# Patient Record
Sex: Male | Born: 1945 | Race: White | Hispanic: No | Marital: Single | Smoking: Former smoker
Health system: Southern US, Community
[De-identification: ages and names within clinical notes are randomized; demographics above are authoritative.]

## PROBLEM LIST (undated history)

## (undated) DIAGNOSIS — J439 Emphysema, unspecified: Secondary | ICD-10-CM

## (undated) DIAGNOSIS — G4730 Sleep apnea, unspecified: Secondary | ICD-10-CM

## (undated) DIAGNOSIS — I2781 Cor pulmonale (chronic): Secondary | ICD-10-CM

## (undated) DIAGNOSIS — E662 Morbid (severe) obesity with alveolar hypoventilation: Secondary | ICD-10-CM

## (undated) DIAGNOSIS — D751 Secondary polycythemia: Secondary | ICD-10-CM

## (undated) DIAGNOSIS — I219 Acute myocardial infarction, unspecified: Secondary | ICD-10-CM

## (undated) DIAGNOSIS — I2510 Atherosclerotic heart disease of native coronary artery without angina pectoris: Secondary | ICD-10-CM

## (undated) DIAGNOSIS — Z9289 Personal history of other medical treatment: Secondary | ICD-10-CM

## (undated) DIAGNOSIS — J9610 Chronic respiratory failure, unspecified whether with hypoxia or hypercapnia: Secondary | ICD-10-CM

## (undated) DIAGNOSIS — I509 Heart failure, unspecified: Secondary | ICD-10-CM

## (undated) HISTORY — DX: Cor pulmonale (chronic): I27.81

## (undated) HISTORY — DX: Emphysema, unspecified: J43.9

## (undated) HISTORY — DX: Morbid (severe) obesity with alveolar hypoventilation: E66.2

## (undated) HISTORY — DX: Personal history of other medical treatment: Z92.89

## (undated) HISTORY — PX: TONSILLECTOMY: SUR1361

## (undated) HISTORY — DX: Secondary polycythemia: D75.1

## (undated) HISTORY — DX: Atherosclerotic heart disease of native coronary artery without angina pectoris: I25.10

## (undated) HISTORY — DX: Chronic respiratory failure, unspecified whether with hypoxia or hypercapnia: J96.10

## (undated) HISTORY — PX: OTHER SURGICAL HISTORY: SHX169

## (undated) HISTORY — PX: VASECTOMY: SHX75

---

## 1996-12-06 ENCOUNTER — Encounter: Payer: Self-pay | Admitting: Pulmonary Disease

## 1997-01-17 ENCOUNTER — Encounter: Payer: Self-pay | Admitting: Pulmonary Disease

## 1998-03-14 ENCOUNTER — Emergency Department (HOSPITAL_COMMUNITY): Admission: EM | Admit: 1998-03-14 | Discharge: 1998-03-14 | Payer: Self-pay | Admitting: Emergency Medicine

## 1998-06-11 ENCOUNTER — Encounter: Payer: Self-pay | Admitting: Cardiovascular Disease

## 1998-06-11 ENCOUNTER — Inpatient Hospital Stay (HOSPITAL_COMMUNITY): Admission: EM | Admit: 1998-06-11 | Discharge: 1998-06-17 | Payer: Self-pay | Admitting: Emergency Medicine

## 1998-06-12 ENCOUNTER — Encounter: Payer: Self-pay | Admitting: Cardiology

## 1998-06-16 ENCOUNTER — Encounter: Payer: Self-pay | Admitting: Pulmonary Disease

## 1998-07-18 ENCOUNTER — Encounter: Payer: Self-pay | Admitting: Pulmonary Disease

## 2003-10-12 ENCOUNTER — Emergency Department (HOSPITAL_COMMUNITY): Admission: AD | Admit: 2003-10-12 | Discharge: 2003-10-13 | Payer: Self-pay | Admitting: Emergency Medicine

## 2004-08-07 ENCOUNTER — Ambulatory Visit: Payer: Self-pay | Admitting: Pulmonary Disease

## 2004-08-25 ENCOUNTER — Ambulatory Visit: Payer: Self-pay | Admitting: Pulmonary Disease

## 2004-09-01 ENCOUNTER — Ambulatory Visit: Payer: Self-pay | Admitting: Pulmonary Disease

## 2004-09-11 ENCOUNTER — Ambulatory Visit: Payer: Self-pay | Admitting: Pulmonary Disease

## 2004-10-13 ENCOUNTER — Ambulatory Visit: Payer: Self-pay | Admitting: Pulmonary Disease

## 2004-11-11 ENCOUNTER — Ambulatory Visit: Payer: Self-pay | Admitting: Pulmonary Disease

## 2005-05-11 ENCOUNTER — Ambulatory Visit: Payer: Self-pay | Admitting: Pulmonary Disease

## 2005-08-11 ENCOUNTER — Ambulatory Visit: Payer: Self-pay | Admitting: Pulmonary Disease

## 2005-10-05 ENCOUNTER — Ambulatory Visit: Payer: Self-pay | Admitting: Pulmonary Disease

## 2005-10-19 ENCOUNTER — Ambulatory Visit: Payer: Self-pay | Admitting: Pulmonary Disease

## 2006-01-19 ENCOUNTER — Ambulatory Visit: Payer: Self-pay | Admitting: Pulmonary Disease

## 2006-05-03 ENCOUNTER — Ambulatory Visit: Payer: Self-pay | Admitting: Pulmonary Disease

## 2006-07-06 ENCOUNTER — Ambulatory Visit: Payer: Self-pay | Admitting: Pulmonary Disease

## 2006-09-01 ENCOUNTER — Ambulatory Visit: Payer: Self-pay | Admitting: Pulmonary Disease

## 2006-10-14 ENCOUNTER — Ambulatory Visit: Payer: Self-pay | Admitting: Pulmonary Disease

## 2006-10-14 LAB — CONVERTED CEMR LAB
ALT: 35 units/L (ref 0–40)
Alkaline Phosphatase: 72 units/L (ref 39–117)
BUN: 11 mg/dL (ref 6–23)
Calcium: 9.3 mg/dL (ref 8.4–10.5)
Chloride: 100 meq/L (ref 96–112)
Cholesterol: 99 mg/dL (ref 0–200)
GFR calc non Af Amer: 104 mL/min
HDL: 34.2 mg/dL — ABNORMAL LOW (ref >39.0)
Hgb A1c MFr Bld: 5.7 % (ref 4.6–6.0)
Potassium: 4.3 meq/L (ref 3.5–5.1)
Total CHOL/HDL Ratio: 2.9
Total Protein: 6.5 g/dL (ref 6.0–8.3)

## 2006-11-07 ENCOUNTER — Ambulatory Visit: Payer: Self-pay | Admitting: Pulmonary Disease

## 2007-04-12 ENCOUNTER — Ambulatory Visit: Payer: Self-pay | Admitting: Pulmonary Disease

## 2007-06-26 ENCOUNTER — Ambulatory Visit: Payer: Self-pay | Admitting: Pulmonary Disease

## 2007-07-01 DIAGNOSIS — J439 Emphysema, unspecified: Secondary | ICD-10-CM

## 2007-07-01 DIAGNOSIS — J9610 Chronic respiratory failure, unspecified whether with hypoxia or hypercapnia: Secondary | ICD-10-CM

## 2007-07-01 DIAGNOSIS — I279 Pulmonary heart disease, unspecified: Secondary | ICD-10-CM | POA: Insufficient documentation

## 2007-07-01 DIAGNOSIS — E662 Morbid (severe) obesity with alveolar hypoventilation: Secondary | ICD-10-CM | POA: Insufficient documentation

## 2007-07-01 DIAGNOSIS — D751 Secondary polycythemia: Secondary | ICD-10-CM

## 2007-11-20 ENCOUNTER — Telehealth (INDEPENDENT_AMBULATORY_CARE_PROVIDER_SITE_OTHER): Payer: Self-pay | Admitting: *Deleted

## 2007-12-04 ENCOUNTER — Telehealth: Payer: Self-pay | Admitting: Pulmonary Disease

## 2007-12-14 ENCOUNTER — Ambulatory Visit: Payer: Self-pay | Admitting: Pulmonary Disease

## 2007-12-19 ENCOUNTER — Encounter: Payer: Self-pay | Admitting: Pulmonary Disease

## 2007-12-21 ENCOUNTER — Telehealth (INDEPENDENT_AMBULATORY_CARE_PROVIDER_SITE_OTHER): Payer: Self-pay | Admitting: *Deleted

## 2007-12-27 ENCOUNTER — Telehealth (INDEPENDENT_AMBULATORY_CARE_PROVIDER_SITE_OTHER): Payer: Self-pay | Admitting: *Deleted

## 2008-03-14 ENCOUNTER — Encounter: Payer: Self-pay | Admitting: Pulmonary Disease

## 2008-03-21 ENCOUNTER — Telehealth: Payer: Self-pay | Admitting: Pulmonary Disease

## 2008-05-01 ENCOUNTER — Ambulatory Visit: Payer: Self-pay | Admitting: Pulmonary Disease

## 2008-05-03 ENCOUNTER — Encounter: Payer: Self-pay | Admitting: Pulmonary Disease

## 2008-05-03 LAB — CONVERTED CEMR LAB
Alkaline Phosphatase: 74 units/L (ref 39–117)
Bilirubin, Direct: 0.2 mg/dL (ref 0.0–0.3)
CO2: 37 meq/L — ABNORMAL HIGH (ref 19–32)
Chloride: 101 meq/L (ref 96–112)
GFR calc Af Amer: 97 mL/min
Glucose, Bld: 87 mg/dL (ref 70–99)
Lymphocytes Relative: 17.8 % (ref 12.0–46.0)
Monocytes Absolute: 0.4 10*3/uL (ref 0.1–1.0)
Monocytes Relative: 4.9 % (ref 3.0–12.0)
Neutrophils Relative %: 71.8 % (ref 43.0–77.0)
Platelets: 130 10*3/uL — ABNORMAL LOW (ref 150–400)
Potassium: 4.1 meq/L (ref 3.5–5.1)
RDW: 13.7 % (ref 11.5–14.6)
Sodium: 142 meq/L (ref 135–145)
Total Protein: 7.2 g/dL (ref 6.0–8.3)

## 2008-05-07 ENCOUNTER — Ambulatory Visit: Payer: Self-pay | Admitting: Pulmonary Disease

## 2008-05-07 ENCOUNTER — Ambulatory Visit: Payer: Self-pay | Admitting: Cardiology

## 2008-05-07 DIAGNOSIS — R0602 Shortness of breath: Secondary | ICD-10-CM | POA: Insufficient documentation

## 2008-05-08 ENCOUNTER — Telehealth (INDEPENDENT_AMBULATORY_CARE_PROVIDER_SITE_OTHER): Payer: Self-pay | Admitting: *Deleted

## 2008-05-08 ENCOUNTER — Encounter: Payer: Self-pay | Admitting: Pulmonary Disease

## 2008-05-09 LAB — CONVERTED CEMR LAB
BUN: 19 mg/dL (ref 6–23)
CO2: 36 meq/L — ABNORMAL HIGH (ref 19–32)
Calcium: 9.7 mg/dL (ref 8.4–10.5)
Chloride: 102 meq/L (ref 96–112)
Creatinine, Ser: 1 mg/dL (ref 0.4–1.5)
GFR calc Af Amer: 97 mL/min
GFR calc non Af Amer: 80 mL/min
Glucose, Bld: 75 mg/dL (ref 70–99)
Potassium: 4.9 meq/L (ref 3.5–5.1)
Sodium: 143 meq/L (ref 135–145)

## 2008-05-13 ENCOUNTER — Encounter: Payer: Self-pay | Admitting: Pulmonary Disease

## 2008-05-14 ENCOUNTER — Encounter: Payer: Self-pay | Admitting: Pulmonary Disease

## 2008-05-20 ENCOUNTER — Encounter: Payer: Self-pay | Admitting: Pulmonary Disease

## 2008-06-05 ENCOUNTER — Ambulatory Visit: Payer: Self-pay | Admitting: Pulmonary Disease

## 2008-06-19 ENCOUNTER — Telehealth: Payer: Self-pay | Admitting: Pulmonary Disease

## 2008-07-02 ENCOUNTER — Encounter: Payer: Self-pay | Admitting: Pulmonary Disease

## 2008-09-06 ENCOUNTER — Encounter: Payer: Self-pay | Admitting: Pulmonary Disease

## 2008-10-15 ENCOUNTER — Telehealth (INDEPENDENT_AMBULATORY_CARE_PROVIDER_SITE_OTHER): Payer: Self-pay | Admitting: *Deleted

## 2008-10-21 ENCOUNTER — Encounter: Payer: Self-pay | Admitting: Pulmonary Disease

## 2008-11-07 ENCOUNTER — Ambulatory Visit: Payer: Self-pay | Admitting: Pulmonary Disease

## 2008-12-12 ENCOUNTER — Encounter: Payer: Self-pay | Admitting: Pulmonary Disease

## 2008-12-21 ENCOUNTER — Encounter: Payer: Self-pay | Admitting: Pulmonary Disease

## 2009-01-15 ENCOUNTER — Encounter: Payer: Self-pay | Admitting: Pulmonary Disease

## 2009-01-23 ENCOUNTER — Encounter: Payer: Self-pay | Admitting: Pulmonary Disease

## 2009-03-28 ENCOUNTER — Ambulatory Visit: Payer: Self-pay | Admitting: Pulmonary Disease

## 2009-06-30 IMAGING — CT CT ANGIO CHEST
2 of 4 series · 19 of 46 positions shown · IV contrast (Omnipaque 300)
Comparison: 05/01/2008 chest x-ray

CLINICAL DATA: Increased short of breath and chest pain; previous
smoker.  History COPD/emphysema.  Sleep apnea.  Coronary artery
stents.

CT ANGIOGRAPHY CHEST
TECHNIQUE: Multidetector CT imaging of the chest using the
standard protocol during bolus administration of intravenous
contrast. Multiplanar reconstructed images obtained and reviewed to
evaluate the vascular anatomy.
Contrast: 80 ml Omnipaque 350 IV

[Series 5: pe_xxl 1.0 b20f st · axial · 0.94mm/px · z∈[-329,-35]mm · 16 of 324 slices shown]
[im 15/324  lung]
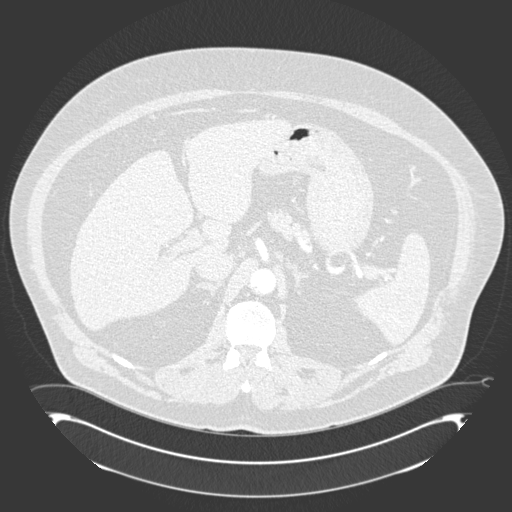
[im 43/324  soft-tissue]
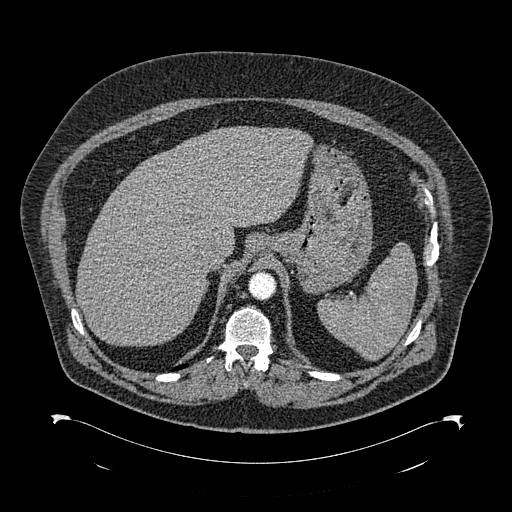
[im 57/324  lung]
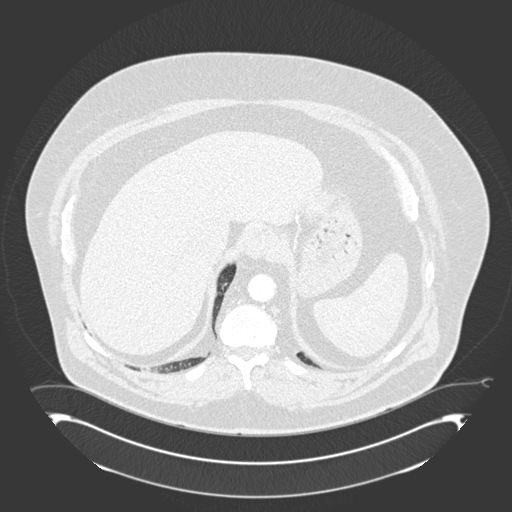
[im 71/324  soft-tissue]
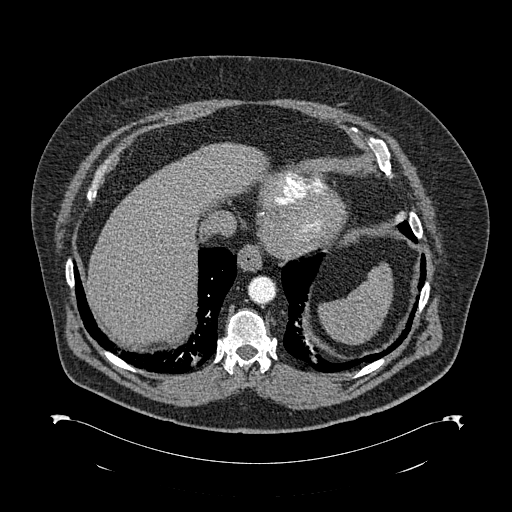
[im 99/324  lung]
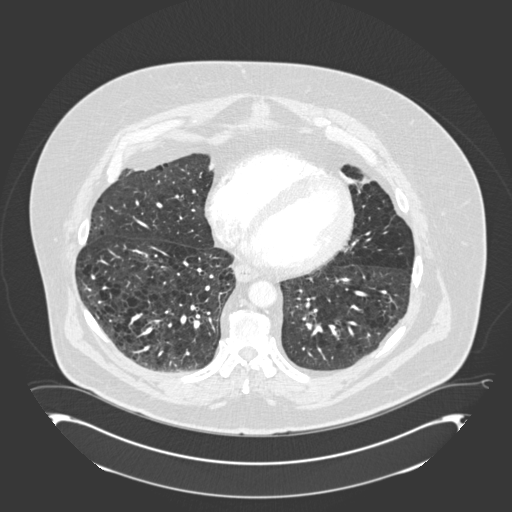
[im 113/324  soft-tissue]
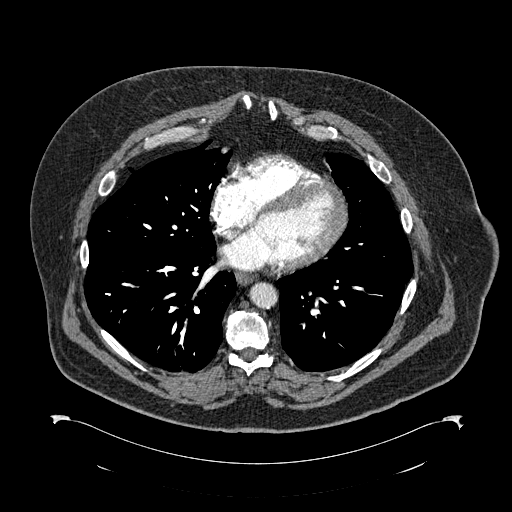
[im 127/324  lung]
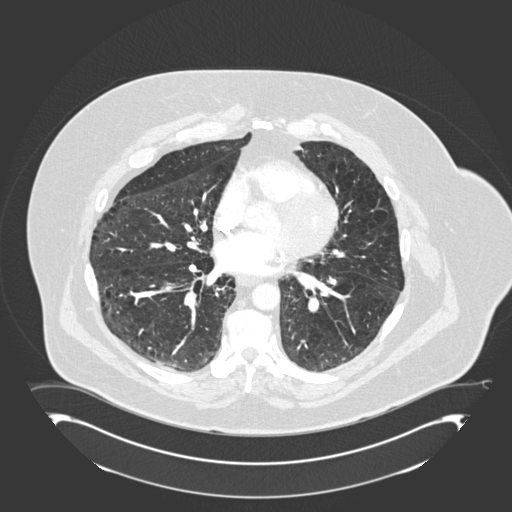
[im 155/324  soft-tissue]
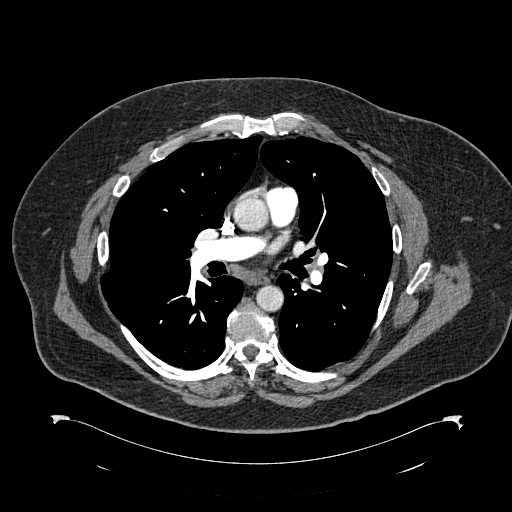
[im 169/324  lung]
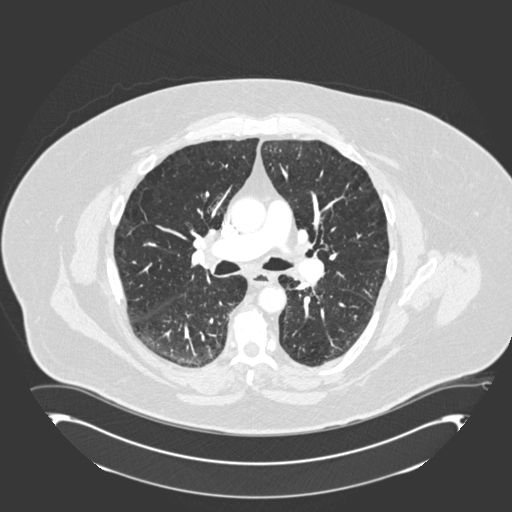
[im 197/324  soft-tissue]
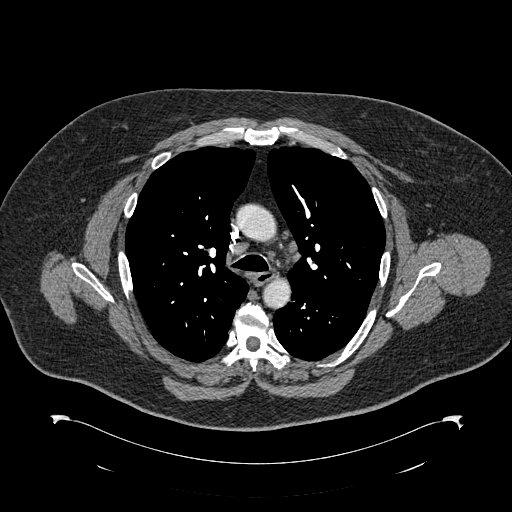
[im 211/324  lung]
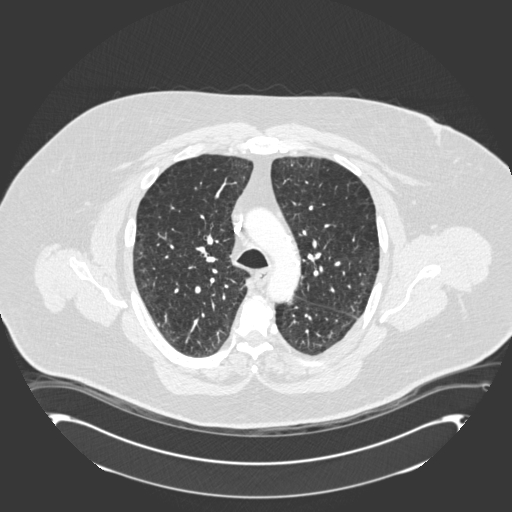
[im 225/324  soft-tissue]
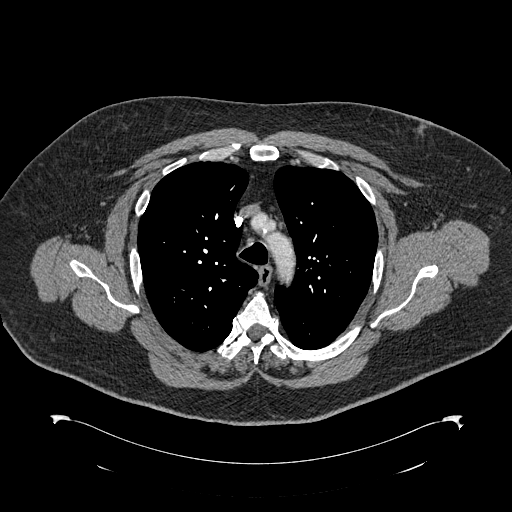
[im 253/324  lung]
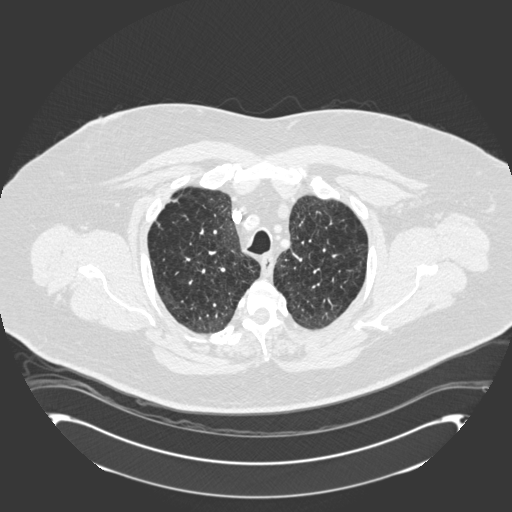
[im 267/324  soft-tissue]
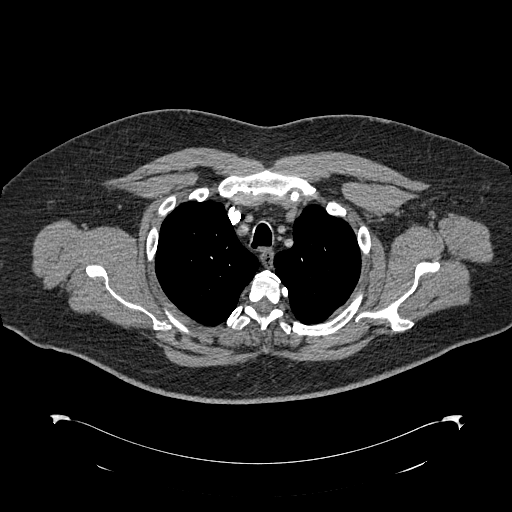
[im 281/324  lung]
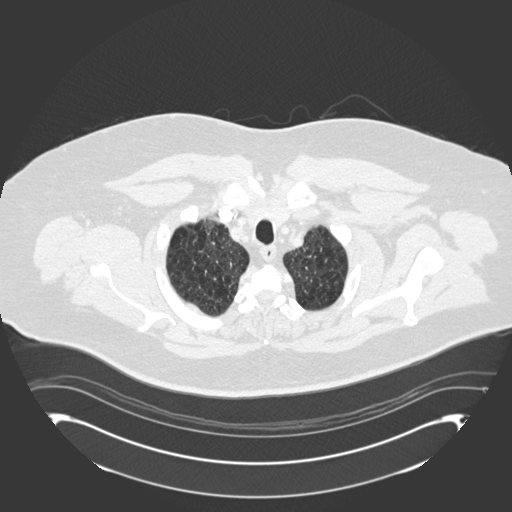
[im 309/324  soft-tissue]
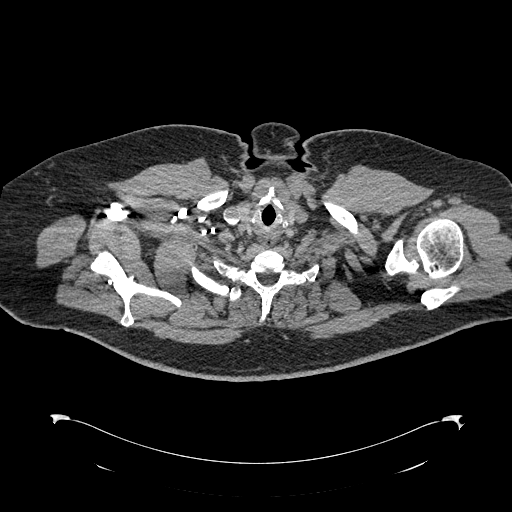

[Series 602: <mpr thick range> · coronal · 0.94mm/px · 3 of 142 slices shown]
[im 48/142  soft-tissue]
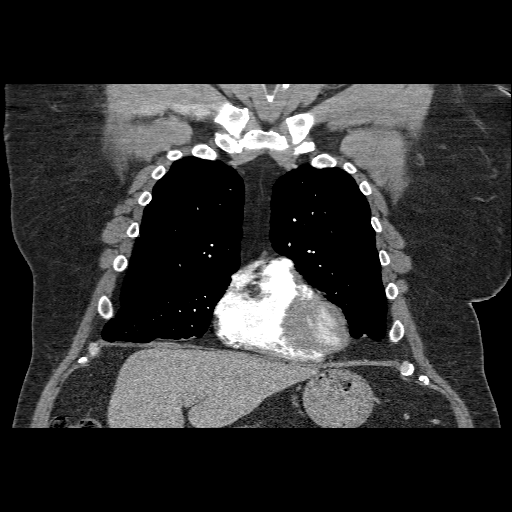
[im 63/142  soft-tissue]
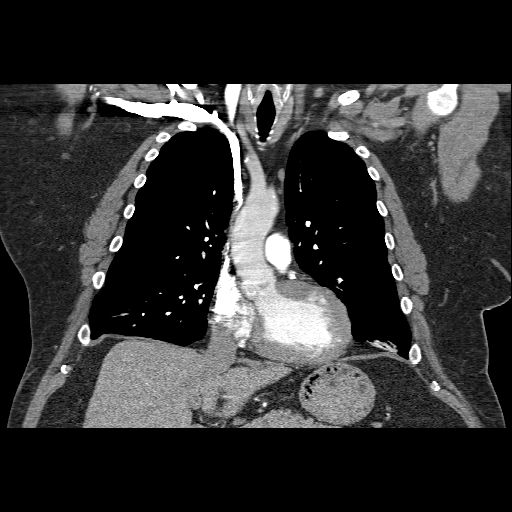
[im 79/142  soft-tissue]
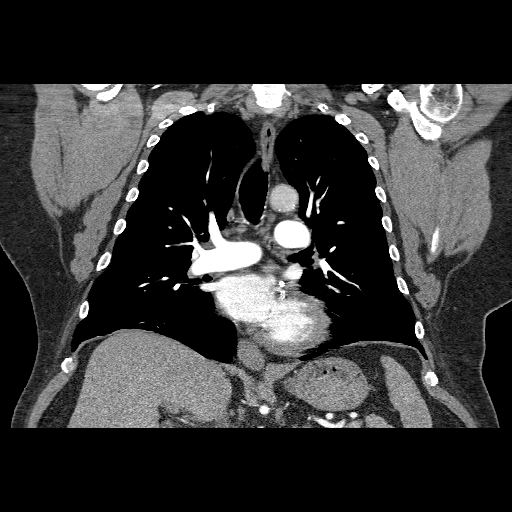

[19 of 46 positions shown; findings below may reference images not displayed]

FINDINGS: Excellent degree of contrast opacification of the pulmonary
arteries.  No findings to suggest pulmonary emboli.  No acute
pulmonary process.  Centrilobular emphysema.  Peripheral subpleural
blebs mainly on the right (paraseptal emphysema).  Bihilar
adenopathy.  Subcarinal adenopathy.  1.1 cm subcarinal node (image
55). Main pulmonary trunk diameter is 2.9 cm.  No pericardial or
pleural effusions.  Linear scarring in the lingular segment.
Esophageal  wall thickening, mainly distally.  This may be due to
esophagitis, possibly on the basis of reflux..
IMPRESSION: Negative for PE.  COPD/emphysema.  Mild mediastinal and bihilar
adenopathy.  Esophageal wall thickening.

## 2009-07-14 ENCOUNTER — Ambulatory Visit: Payer: Self-pay | Admitting: Pulmonary Disease

## 2009-07-17 ENCOUNTER — Encounter: Payer: Self-pay | Admitting: Pulmonary Disease

## 2009-07-29 ENCOUNTER — Telehealth (INDEPENDENT_AMBULATORY_CARE_PROVIDER_SITE_OTHER): Payer: Self-pay | Admitting: *Deleted

## 2009-09-20 ENCOUNTER — Encounter: Payer: Self-pay | Admitting: Pulmonary Disease

## 2009-11-12 ENCOUNTER — Ambulatory Visit: Payer: Self-pay | Admitting: Pulmonary Disease

## 2010-01-31 ENCOUNTER — Encounter: Payer: Self-pay | Admitting: Pulmonary Disease

## 2010-02-02 ENCOUNTER — Inpatient Hospital Stay (HOSPITAL_COMMUNITY): Admission: AD | Admit: 2010-02-02 | Discharge: 2010-02-05 | Payer: Self-pay | Admitting: Pulmonary Disease

## 2010-02-02 ENCOUNTER — Ambulatory Visit: Payer: Self-pay | Admitting: Pulmonary Disease

## 2010-02-02 DIAGNOSIS — J9600 Acute respiratory failure, unspecified whether with hypoxia or hypercapnia: Secondary | ICD-10-CM | POA: Insufficient documentation

## 2010-02-03 ENCOUNTER — Encounter: Payer: Self-pay | Admitting: Pulmonary Disease

## 2010-02-06 ENCOUNTER — Telehealth: Payer: Self-pay | Admitting: Pulmonary Disease

## 2010-02-13 ENCOUNTER — Ambulatory Visit: Payer: Self-pay | Admitting: Pulmonary Disease

## 2010-02-13 DIAGNOSIS — IMO0001 Reserved for inherently not codable concepts without codable children: Secondary | ICD-10-CM | POA: Insufficient documentation

## 2010-02-13 LAB — CONVERTED CEMR LAB
Basophils Relative: 0 % (ref 0.0–3.0)
Eosinophils Relative: 0.5 % (ref 0.0–5.0)
HCT: 50.1 % (ref 39.0–52.0)
Lymphs Abs: 1 10*3/uL (ref 0.7–4.0)
MCV: 90 fL (ref 78.0–100.0)
Monocytes Absolute: 0.3 10*3/uL (ref 0.1–1.0)
Neutro Abs: 11.2 10*3/uL — ABNORMAL HIGH (ref 1.4–7.7)
Platelets: 78 10*3/uL — ABNORMAL LOW (ref 150.0–400.0)
RBC: 5.56 M/uL (ref 4.22–5.81)
WBC: 12.6 10*3/uL — ABNORMAL HIGH (ref 4.5–10.5)

## 2010-02-16 ENCOUNTER — Telehealth (INDEPENDENT_AMBULATORY_CARE_PROVIDER_SITE_OTHER): Payer: Self-pay | Admitting: *Deleted

## 2010-02-17 ENCOUNTER — Telehealth (INDEPENDENT_AMBULATORY_CARE_PROVIDER_SITE_OTHER): Payer: Self-pay | Admitting: *Deleted

## 2010-02-18 ENCOUNTER — Encounter: Payer: Self-pay | Admitting: Pulmonary Disease

## 2010-02-26 ENCOUNTER — Encounter: Payer: Self-pay | Admitting: Pulmonary Disease

## 2010-02-26 ENCOUNTER — Telehealth: Payer: Self-pay | Admitting: Pulmonary Disease

## 2010-03-06 ENCOUNTER — Encounter: Payer: Self-pay | Admitting: Pulmonary Disease

## 2010-03-10 ENCOUNTER — Encounter: Payer: Self-pay | Admitting: Pulmonary Disease

## 2010-03-10 DIAGNOSIS — Z9289 Personal history of other medical treatment: Secondary | ICD-10-CM

## 2010-03-10 HISTORY — DX: Personal history of other medical treatment: Z92.89

## 2010-03-11 ENCOUNTER — Encounter: Payer: Self-pay | Admitting: Pulmonary Disease

## 2010-03-11 ENCOUNTER — Ambulatory Visit: Payer: Self-pay | Admitting: Oncology

## 2010-03-12 LAB — CONVERTED CEMR LAB
Basophils Absolute: 0 10*3/uL (ref 0.0–0.1)
Eosinophils Absolute: 0.3 10*3/uL (ref 0.0–0.7)
Eosinophils Relative: 6 % — ABNORMAL HIGH (ref 0–5)
HCT: 51.4 % (ref 39.0–52.0)
MCHC: 33.3 g/dL (ref 30.0–36.0)
MCV: 92.6 fL (ref 78.0–100.0)
Platelets: 91 10*3/uL — ABNORMAL LOW (ref 150–400)
RDW: 14 % (ref 11.5–15.5)

## 2010-03-25 ENCOUNTER — Encounter: Payer: Self-pay | Admitting: Pulmonary Disease

## 2010-03-25 LAB — CBC & DIFF AND RETIC
Basophils Absolute: 0 10*3/uL (ref 0.0–0.1)
EOS%: 4.2 % (ref 0.0–7.0)
HGB: 16.9 g/dL (ref 13.0–17.1)
LYMPH%: 21.4 % (ref 14.0–49.0)
MCH: 30.7 pg (ref 27.2–33.4)
MCV: 90.4 fL (ref 79.3–98.0)
MONO%: 5.7 % (ref 0.0–14.0)
Platelets: 92 10*3/uL — ABNORMAL LOW (ref 140–400)
RBC: 5.5 10*6/uL (ref 4.20–5.82)
RDW: 13.6 % (ref 11.0–14.6)
Retic %: 0.96 % (ref 0.50–1.60)

## 2010-03-25 LAB — MORPHOLOGY
PLT EST: DECREASED
RBC Comments: NORMAL

## 2010-03-26 LAB — ANTI-NUCLEAR AB-TITER (ANA TITER): ANA Titer 1: NEGATIVE

## 2010-03-27 ENCOUNTER — Encounter: Payer: Self-pay | Admitting: Pulmonary Disease

## 2010-04-16 ENCOUNTER — Ambulatory Visit: Payer: Self-pay | Admitting: Pulmonary Disease

## 2010-05-21 ENCOUNTER — Encounter: Payer: Self-pay | Admitting: Pulmonary Disease

## 2010-07-14 ENCOUNTER — Encounter: Payer: Self-pay | Admitting: Pulmonary Disease

## 2010-07-14 DIAGNOSIS — E785 Hyperlipidemia, unspecified: Secondary | ICD-10-CM

## 2010-07-15 ENCOUNTER — Ambulatory Visit: Payer: Self-pay | Admitting: Pulmonary Disease

## 2010-07-16 LAB — CONVERTED CEMR LAB
Direct LDL: 194.3 mg/dL
Total CHOL/HDL Ratio: 6

## 2010-09-07 ENCOUNTER — Telehealth: Payer: Self-pay | Admitting: Pulmonary Disease

## 2010-09-10 ENCOUNTER — Encounter: Payer: Self-pay | Admitting: Pulmonary Disease

## 2010-09-10 ENCOUNTER — Ambulatory Visit
Admission: RE | Admit: 2010-09-10 | Discharge: 2010-09-10 | Payer: Self-pay | Source: Home / Self Care | Attending: Pulmonary Disease | Admitting: Pulmonary Disease

## 2010-09-10 ENCOUNTER — Telehealth: Payer: Self-pay | Admitting: Pulmonary Disease

## 2010-09-11 ENCOUNTER — Telehealth: Payer: Self-pay | Admitting: Pulmonary Disease

## 2010-09-29 NOTE — Letter (Signed)
[facility]   Imported By: Sherian Rein 04/22/2010 10:09:37  _____________________________________________________________________  External Attachment:    Type:   Image     Comment:   External Document

## 2010-09-29 NOTE — Miscellaneous (Signed)
Summary: flu vax on 05/21/10 by [facility]  Clinical Lists Changes  Observations: Added new observation of FLU VAX: given by [facility] (05/21/2010 16:48)      Immunization History:  Influenza Immunization History:    Influenza:  given by [facility] (05/21/2010)

## 2010-09-29 NOTE — Assessment & Plan Note (Signed)
Summary: post hospital f/u for myositis and copd exacerbation.   Copy to:  Alanda Amass Primary Provider/Referring Provider:  Shelle Iron  CC:  Pt is here for a post hosp f/u appt.  Pt was at [facility] from June 6 to June 9 and 2011.  Pt c/o coughing up white sputum.  Pt states breathing is back to normal baseline.  Pt has finished abx and pred taper. .  History of Present Illness: The pt comes in today for f/u after his recent hospitalization.  He had acute myositis that I believe was secondary to recently starting on zocor, had thrombocytopenia that I suspect was drug induced, and a mild copd exacerbation.  His cpk and platelet count improved with d/c zocor, and his ESR was normal.  He was treated with abx and a course of prednisone, and has seen a return of his breathing back to baseline.  Current Medications (verified): 1)  Gnp Aspirin Low Dose 81 Mg Tbec (Aspirin) .... Take 1 Tablet By Mouth Once A Day 2)  Atenolol 25 Mg  Tabs (Atenolol) .... Take One Tab By Mouth Once Daily 3)  Furosemide 40 Mg  Tabs (Furosemide) .... Take 1 Tablet By Mouth Once A Day 4)  Multivitamins   Tabs (Multiple Vitamin) .... Take One Tab By Mouth Once Daily 5)  Pulmicort 0.5 Mg/10ml  Susp (Budesonide (Inhalation)) .... Use As Directed Two Times A Day 6)  Albuterol Sulfate (2.5 Mg/33ml) 0.083%  Nebu (Albuterol Sulfate) .... Use 1 Vial in [employer] Four Times A Day 7)  Ipratropium Bromide 0.02 % Soln (Ipratropium Bromide) .Marland Kitchen.. 1 Vial Via [employer] Four Times A Day 8)  Bipap .... At Bedtime  Allergies (verified): 1)  ! * Simvastatin  Review of Systems       The patient complains of shortness of breath with activity and productive cough.  The patient denies shortness of breath at rest, non-productive cough, coughing up blood, chest pain, irregular heartbeats, acid heartburn, indigestion, loss of appetite, weight change, abdominal pain, difficulty swallowing, sore throat, tooth/dental problems, headaches, nasal congestion/difficulty breathing  through nose, sneezing, itching, ear ache, anxiety, depression, hand/feet swelling, joint stiffness or pain, rash, change in color of mucus, and fever.    Vital Signs:  Patient profile:   65 year old male Height:      65 inches Weight:      237 pounds BMI:     39.58 O2 Sat:      90 % on 4 LPM pulsed Temp:     98.0 degrees F oral Pulse rate:   83 / minute BP sitting:   116 / 70  (left arm) Cuff size:   regular  Vitals Entered By: Arman Filter LPN (February 13, 2010 8:56 AM)  O2 Flow:  4 LPM pulsed CC: Pt is here for a post hosp f/u appt.  Pt was at [facility] from June 6 to February 05, 2010.  Pt c/o coughing up white sputum.  Pt states breathing is back to normal baseline.  Pt has finished abx and pred taper.  Comments Medications reviewed with patient Arman Filter LPN  February 13, 2010 9:04 AM    Physical Exam  General:  obese male in nad Lungs:  decreased bs throughout, no wheezing or rhonchi Heart:  rrr, no mrg Extremities:  no edema noted, no cyanosis Neurologic:  alert and oriented, moves all 4.   Impression & Recommendations:  Problem # 1:  MYOSITIS (ICD-729.1) the pt was recently in the hospital with acute myositis that  I think was due to simvastatin.  His medication was recently changed from lipitor to simvastatin, and his symptoms began shortly thereafter.  By the end of his hospitalization, his cpk's were returning to normal.  Will recheck his levels today, and restart lipitor once they are normalized.  Of note, his ESR in hospital was normal.  He also had thrombocytopenia in the hospital, and this normalized as well by day of discharge.  Problem # 2:  EMPHYSEMA (ICD-492.8) the pt was having increased congestion in the hospital, and therefore treated for mild copd exacerbation.  He had thick tenacious secretions upon discharge, but improved with mucinex and flutter valve.  He feels that he is now back to baseline after finishing abx and prednisone.  Other Orders: Est. Patient Level  III (78295) TLB-CBC Platelet - w/Differential (85025-CBCD) TLB-CK Total Only(Creatine Kinase/CPK) (82550-CK)  Patient Instructions: 1)  stay off simvastatin for now, but will recheck your bloodwork and decide about restarting lipitor 2)  no change in current meds. 3)  followup with me in 2mos or sooner if problems.

## 2010-09-29 NOTE — Assessment & Plan Note (Signed)
Summary: rov for ohs, copd   Visit Type:  Follow-up Copy to:  Alanda Amass Primary Taurean Ju/Referring Glendia Olshefski:  Marcelyn Bruins   CC:  3 month follow up. pt states he has "good and "bad" days. pt states he has no new concerns. pt denies any cough. Pt quit smoking 2002. Pt states he still uses bipap at night. pt states he needs a new mask. Marland Kitchen  History of Present Illness: the pt comes in today for f/u of his known emphysema, OHS/OSA.  He is wearing his bipap and oxygen compliantly, but is due for a new mask.  He denies any worsening of his breathing, and feels he is near his usual baseline.  He denies any cough or congestion.  He also had his fasting lipid profile today to recheck his numbers off zocor.  Recall, he had acute myositis related to zocor that required hospitalization.  He has been on lipitor in the past which he tolerated well.  Current Medications (verified): 1)  Gnp Aspirin Low Dose 81 Mg Tbec (Aspirin) .... Take 1 Tablet By Mouth Once A Day 2)  Atenolol 25 Mg  Tabs (Atenolol) .... Take One Tab By Mouth Once Daily 3)  Furosemide 40 Mg  Tabs (Furosemide) .... Take 1 Tablet By Mouth Once A Day 4)  Multivitamins   Tabs (Multiple Vitamin) .... Take One Tab By Mouth Once Daily 5)  Pulmicort 0.5 Mg/44ml  Susp (Budesonide (Inhalation)) .... Use As Directed Two Times A Day 6)  Albuterol Sulfate (2.5 Mg/22ml) 0.083%  Nebu (Albuterol Sulfate) .... Use 1 Vial in [employer] Four Times A Day 7)  Ipratropium Bromide 0.02 % Soln (Ipratropium Bromide) .Marland Kitchen.. 1 Vial Via [employer]hn Four Times A Day 8)  Bipap .... At Bedtime  Allergies (verified): 1)  ! * Simvastatin  Review of Systems       The patient complains of shortness of breath with activity and shortness of breath at rest.  The patient denies productive cough, non-productive cough, coughing up blood, chest pain, irregular heartbeats, acid heartburn, indigestion, loss of appetite, weight change, abdominal pain, difficulty swallowing, sore throat, tooth/dental  problems, headaches, nasal congestion/difficulty breathing through nose, sneezing, itching, ear ache, anxiety, depression, hand/feet swelling, joint stiffness or pain, rash, change in color of mucus, and fever.    Vital Signs:  Patient profile:   65 year old male Height:      65 inches Weight:      235.50 pounds BMI:     39.33 O2 Sat:      91 % on 3 L/min pulsed Temp:     98 degrees F oral Pulse rate:   87 / minute BP sitting:   118 / 80  (left arm) Cuff size:   large  Vitals Entered By: Carver Fila (July 15, 2010 8:50 AM)  O2 Flow:  3 L/min pulsed CC: 3 month follow up. pt states he has "good and "bad" days. pt states he has no new concerns. pt denies any cough. Pt quit smoking 2002. Pt states he still uses bipap at night. pt states he needs a new mask.  Comments meds and allergies updated Phone number updated Carver Fila  July 15, 2010 8:50 AM    Physical Exam  General:  obese male in nad Nose:  no skin breakdown or pressure necrosis from cpap mask Lungs:  decreased bs, no crackles or rhonchi Heart:  rrr, no mrg Extremities:  no edema or cyanosis  Neurologic:  alert and oriented, moves all 4.  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  Will arrange for a new mask, and he is to stay on bipap.  Problem # 2:  EMPHYSEMA (ICD-492.8) He is maintaining on his bronchodilator regimen, and has not had any recent acute exacerbation.  Other Orders: Est. Patient Level III (70623) DME Referral (DME)  Patient Instructions: 1)  will get neb supplies and mask for you from [facility] 2)  continue to work on weight loss. 3)  will call you about your lipid profile. 4)  followup with me in 4mos.

## 2010-09-29 NOTE — Assessment & Plan Note (Signed)
Summary: rov for OHS, emphysema   Copy to:  Alanda Amass Primary Provider/Referring Provider:  Shelle Iron  CC:  Pt is here for a 4 month f/u appt. Pt states breathing unchanged from last visit.  Pt denied any new complaints.  .  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  Overall, he feels he is at a stable baseline, and has been compliant with his oxygen and bipap.  His weight is up 5 pounds since his last visit though.  He denies any congestion, cough, or worsening sob.  He has not had LE edema.  Medications Prior to Update: 1)  Gnp Aspirin Low Dose 81 Mg Tbec (Aspirin) .... Take 1 Tablet By Mouth Once A Day 2)  Atenolol 25 Mg  Tabs (Atenolol) .... Take One Tab By Mouth Once Daily 3)  Furosemide 40 Mg  Tabs (Furosemide) .... Take 1 Tablet By Mouth Once A Day 4)  Simvastatin 40 Mg Tabs (Simvastatin) .... Take 1 Tablet By Mouth Once A Day 5)  Multivitamins   Tabs (Multiple Vitamin) .... Take One Tab By Mouth Once Daily 6)  Pulmicort 0.5 Mg/47ml  Susp (Budesonide (Inhalation)) .... Use As Directed Two Times A Day 7)  Albuterol Sulfate (2.5 Mg/76ml) 0.083%  Nebu (Albuterol Sulfate) .... Use 1 Vial in [employer] Four Times A Day 8)  Ipratropium Bromide 0.02 % Soln (Ipratropium Bromide) .Marland Kitchen.. 1 Vial Via [facility]hn Four Times A Day 9)  Bipap .... At Bedtime 10)  Acyclovir 200 Mg Caps (Acyclovir) .... Take 2 Tabs By Mouth Two Times A Day  Allergies (verified): No Known Drug Allergies  Review of Systems      See HPI  Vital Signs:  Patient profile:   65 year old male Height:      65 inches Weight:      249.38 pounds O2 Sat:      84 % on 3 LPM pulsed  Temp:     98.1 degrees F oral Pulse rate:   82 / minute BP sitting:   126 / 72  (left arm) Cuff size:   regular  Vitals Entered By: Arman Filter LPN (November 12, 2009 1:31 PM)  O2 Flow:  3 LPM pulsed  CC: Pt is here for a 4 month f/u appt. Pt states breathing unchanged from last  visit.  Pt denied any new complaints.   Comments Medications reviewed with patient  Arman Filter LPN  November 12, 2009 1:38 PM    Physical Exam  General:  obese male in nad Nose:  no skin breakdown or pressure necrosis from cpap mask Lungs:  decreased bs, but totally clear to auscultation Heart:  rrr, no mrg Extremities:  no edema or cyanosis Neurologic:  alert,not sleepy, moves all 4.   Impression & Recommendations:  Problem # 1:  OBESITY HYPOVENTILATION SYNDROME (ICD-278.8) the pt is doing well with his bipap, and feels that he is resting well.  He denies any significant EDS.  I have reminded him the key is further weight loss.  Problem # 2:  EMPHYSEMA (ICD-492.8) he has no bronchospasm on exam today, and has not had any recent acute exacerbation.  He is to continue on current meds.  Other Orders: Est. Patient Level II (09811)  Patient Instructions: 1)  no change in meds 2)  work on weight loss 3)  will track down lab results and let you know results. 4)  followup with me in 4mos.

## 2010-09-29 NOTE — Progress Notes (Signed)
Summary: LAB QUESTION  Phone Note Call from Patient Call back at Home Phone [phone]   Caller: Patient Call For: [facility] Summary of Call: WANT TO SPEAK TO MEGAN ABOUT SOME LAB WORK Initial call taken by: Lacinda Axon,  February 16, 2010 12:31 PM  Follow-up for Phone Call        Spoke with pt.  He states that he got a letter from [facility] stating that ins would not cover his PSA test because he had more than one on a year.  He wants to know if [facility] ordered another PSA besides the one he had done on 01/31/10.  I advised that looking back through the chart I only see the one done this month.  Pt verbalized understanding and states that he will call [facility] to discuss his bill further. Follow-up by: Vernie Murders,  February 16, 2010 2:54 PM

## 2010-09-29 NOTE — Progress Notes (Signed)
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 Follow-up by: Barbaraann Share MD,  February 26, 2010 3:52 PM     Appended Document: seat belt [facility] did a letter and it is at the front desk for pt to pick up.  [id number]

## 2010-09-29 NOTE — Progress Notes (Signed)
Summary: fax  Phone Note Call from Patient Call back at [phone]   Caller: [facility] leona Call For: clance Summary of Call: calling about faxed prescript sent on 02/12/2010 Initial call taken by: Rickard Patience,  February 17, 2010 2:10 PM  Follow-up for Phone Call        called spoke with leona who states that per request sent 02-12-10 an rx is needed for pt's o2 to be supplied through [facility] who is the perferred provider per pt's insurance.  leona states that they have libby listed as their contact but libby stated that she did not receive the fax.  i gave leona the triage fax # to resend to my attn.  will place in [facility] lookat.  leona is aware that [facility] will not be back in the office until tomorrow morning.  she verbalized her understanding. Boone Master CNA/MA  February 17, 2010 2:58 PM   Additional Follow-up for Phone Call Additional follow up Details #1::        received fax from leona.  it is identical to a form already signed by [facility] and faxed.    called leona and informed her of this.  she stated that it is okay to refax to her at [phone].  rx faxed to [facility]. Boone Master CNA/MA  February 17, 2010 3:06 PM

## 2010-09-29 NOTE — Letter (Signed)
Summary: DME order for Oxygen/[facility]  DME order for Oxygen/[facility]   Imported By: Sherian Rein 02/16/2010 13:22:59  _____________________________________________________________________  External Attachment:    Type:   Image     Comment:   External Document

## 2010-09-29 NOTE — Letter (Signed)
Summary: [employer] Letter for patient/[place] [facility] Letter for patient/[facility]   Imported By: Sherian Rein 03/06/2010 09:28:02  _____________________________________________________________________  External Attachment:    Type:   Image     Comment:   External Document

## 2010-09-29 NOTE — Assessment & Plan Note (Signed)
Summary: acute sick visit for worsening sob.    Copy to:  Alanda Amass Primary Provider/Referring Provider:  Shelle Iron  CC:  Pt is here for an acute sick visit.  Pt c/o increased sob with exertion and at rest.  Pt c/o difficulty coughing up sputum but will occ cough up white sputum.  Pt also c/o nasal congestion, feeling weak, body aches, and chest tightness/pain.  Symptoms started approx 2 to 3 days ago.  Marland Kitchen  History of Present Illness: The pt is a 64y/o male who comes into the office for an acute sick visit.  He has known emphysema, chronic RF, OHS, and cor pulmonale.  He also has known CM with decreased EF.  He gives a 5 day h/o progressive sob associated with increased congestion and chest tightness.  The pain does not feel anginal to him.  He denies pleuritic cp or LE edema.  He feels that he has to cough, but is unable to produce mucus.  He also has severe head congestion.  He has had no fever, chills, sweats.  He has been wearing his bipap more during the day to get him thru his episodes of increased sob.  His sats today were 84% on his usual oxygen, and he has increased wob.  Preventive Screening-Counseling & Management  Alcohol-Tobacco     Smoking Status: quit  Medications Prior to Update: 1)  Gnp Aspirin Low Dose 81 Mg Tbec (Aspirin) .... Take 1 Tablet By Mouth Once A Day 2)  Atenolol 25 Mg  Tabs (Atenolol) .... Take One Tab By Mouth Once Daily 3)  Furosemide 40 Mg  Tabs (Furosemide) .... Take 1 Tablet By Mouth Once A Day 4)  Simvastatin 40 Mg Tabs (Simvastatin) .... Take 1 Tablet By Mouth Once A Day 5)  Multivitamins   Tabs (Multiple Vitamin) .... Take One Tab By Mouth Once Daily 6)  Pulmicort 0.5 Mg/22ml  Susp (Budesonide (Inhalation)) .... Use As Directed Two Times A Day 7)  Albuterol Sulfate (2.5 Mg/53ml) 0.083%  Nebu (Albuterol Sulfate) .... Use 1 Vial in [employer] Four Times A Day 8)  Ipratropium Bromide 0.02 % Soln (Ipratropium Bromide) .Marland Kitchen.. 1 Vial Via [employer] Four Times A Day 9)  Bipap ....  At Bedtime 10)  Acyclovir 200 Mg Caps (Acyclovir) .... 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 mother: healthy father: lung ca brothers: healthy no sisters  Social History: Reviewed history and no changes required. Patient states former smoker. started at age 27.  3 ppd . quit 2001. pt is not married. pt has children. pt is a retired Psychologist, occupational.   Review of Systems       The patient complains of shortness of breath with activity, shortness of breath at rest, productive cough, non-productive cough, chest pain, and nasal congestion/difficulty breathing through nose.  The patient denies coughing up blood, irregular heartbeats, acid heartburn, indigestion, loss of appetite, weight change, abdominal pain, difficulty swallowing, sore throat, tooth/dental problems, headaches, sneezing, itching, ear ache, anxiety, depression, hand/feet swelling, joint stiffness or pain, rash, change in color of mucus, and fever.    Vital Signs:  Patient profile:   65 year old male Height:      65 inches Weight:  245 pounds BMI:     40.92 O2 Sat:      85 % on 4 LPM pulsed Temp:     98.5 degrees F oral Pulse rate:   108 / minute BP sitting:   122 / 80  (right arm) Cuff size:   regular  Vitals Entered By: Arman Filter LPN (February 02, 1609 2:21 PM)  O2 Flow:  4 LPM pulsed CC: Pt is here for an acute sick visit.  Pt c/o increased sob with exertion and at rest.  Pt c/o difficulty coughing up sputum but will occ cough up white sputum.  Pt also c/o nasal congestion, feeling weak, body aches, chest tightness/pain.  Symptoms started approx 2 to 3 days  ago.   Comments Medications reviewed with patient Arman Filter LPN  February 03, 9603 2:21 PM    Physical Exam  General:  morbidly obese male with increased wob Eyes:  PERRLA and EOMI.   Nose:  bloody nares bilat, with congestion. Mouth:  clear Neck:  no jvd, tmg, LN Lungs:  very decreased bs, no wheezing Heart:  mildly tachycardic but regular rhythm no mrg Abdomen:  soft and nontender, bs+ Extremities:  no edema noted, no cyanosis pulses decreased distally, but present Neurologic:  alert and oriented,moves all 4.   Impression & Recommendations:  Problem # 1:  ACUTE RESPIRATORY FAILURE (ICD-518.81)  the pt has acute on chronic RF over the past 4-5 days.  It is unclear whether this represents a copd exacerbation, pna, cardiac issue, or some other acute process.  He is hypoxic in the office today, has excessive wob and tachycardia, and looks a little ashen.  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  Will treat with iv abx and steroids, continue an aggressive bronchodilator regimen.  Problem # 4:  COR PULMONALE (ICD-416.9)  Other Orders: No Charge Patient Arrived (NCPA0) (NCPA0)  Patient Instructions: 1)  will admit you to hospital for xrays, IV antibiotics,  IV steroids

## 2010-09-29 NOTE — Assessment & Plan Note (Signed)
Summary: rov for ohs, emphysema   Copy to:  Alanda Amass Primary Provider/Referring Provider:  Marcelyn Bruins   CC:  Pt is here for a 2 month f/u appt.  Pt denied increased sob or cough.  Pt denied any new concerns.  Pt denied any complaints with his bipap.  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  He has done well since the last visit, and has lost 8 pounds.  He feels that he is breathing at a reasonable level, and has no chest congestion or cough.  He is wearing his cpap compliantly, and also his oxygen.  He has seen hematology regarding his thrombocytopenia, and they felt it was not due to a hematologic issue.    Medications Prior to Update: 1)  Gnp Aspirin Low Dose 81 Mg Tbec (Aspirin) .... Take 1 Tablet By Mouth Once A Day 2)  Atenolol 25 Mg  Tabs (Atenolol) .... Take One Tab By Mouth Once Daily 3)  Furosemide 40 Mg  Tabs (Furosemide) .... Take 1 Tablet By Mouth Once A Day 4)  Multivitamins   Tabs (Multiple Vitamin) .... Take One Tab By Mouth Once Daily 5)  Pulmicort 0.5 Mg/44ml  Susp (Budesonide (Inhalation)) .... Use As Directed Two Times A Day 6)  Albuterol Sulfate (2.5 Mg/79ml) 0.083%  Nebu (Albuterol Sulfate) .... Use 1 Vial in [employer] Four Times A Day 7)  Ipratropium Bromide 0.02 % Soln (Ipratropium Bromide) .Marland Kitchen.. 1 Vial [facility] Four Times A Day 8)  Bipap .... At Bedtime  Allergies (verified): 1)  ! * Simvastatin  Review of Systems       The patient complains of shortness of breath with activity.  The patient denies shortness of breath at rest, productive cough, non-productive cough, coughing up blood, chest pain, irregular heartbeats, acid heartburn, indigestion, loss of appetite, weight change, abdominal pain, difficulty swallowing, sore throat, tooth/dental problems, headaches, nasal congestion/difficulty breathing through nose, sneezing, itching, ear ache, anxiety, depression, hand/feet swelling, joint stiffness or pain,  rash, change in color of mucus, and fever.    Vital Signs:  Patient profile:   65 year old male Height:      65 inches Weight:      232.38 pounds BMI:     38.81 O2 Sat:      91 % on 3 LPM pulsed Temp:     98.2 degrees F oral Pulse rate:   71 / minute BP sitting:   114 / 68  (left arm) Cuff size:   regular  Vitals Entered By: Arman Filter LPN (April 16, 2010 1:35 PM)  O2 Flow:  3 LPM pulsed CC: Pt is here for a 2 month f/u appt.  Pt denied increased sob or cough.  Pt denied any new concerns.  Pt denied any complaints with his bipap.   Comments Medications reviewed with patient Arman Filter LPN  April 16, 2010 1:40 PM    Physical Exam  General:  ow male in nad Nose:  no skin breakdown or pressure necrosis from cpap mask Lungs:  decreased bs throughout, no wheezing or rhonchi Heart:  rrr, no mrg Extremities:  no edema or cyanosis  Neurologic:  alert and oriented, moves all 4.   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  He has lost weight, and I have  asked him to continue.    Problem # 2:  EMPHYSEMA (ICD-492.8)  the pt is doing well on his current bronchodilator regimen.  Other Orders: Est. Patient Level III (16109)  Patient Instructions: 1)  will do fasting lipid profile before next visit 2)  continue to work on weight loss 3)  follow up with me in 3mos.

## 2010-09-29 NOTE — Miscellaneous (Signed)
Summary: Orders Update  Clinical Lists Changes  Orders: Added new Referral order of Hematology Referral (Hematology) - Signed  Appended Document: Myocardial Perfusion test results received Myocardial Perfusion test results from [facility].  Put in KC's very important look at folder for him to review.  Arman Filter LPN  March 23, 2010 2:56 PM    Clinical Lists Changes

## 2010-09-29 NOTE — Progress Notes (Signed)
Summary: [facility]  Phone Note Call from Patient   Caller: Jonny Ruiz Call For: Braidon Chermak Summary of Call: just came home from hosp yesterday still has alot of mucus what can he take for this? Initial call taken by: Oneita Jolly,  February 06, 2010 2:11 PM  Follow-up for Phone Call        he is already on abx, and can take mucinex extra strength (not dm) one in am and pm Follow-up by: Barbaraann Share MD,  February 06, 2010 5:15 PM  Additional Follow-up for Phone Call Additional follow up Details #1::        Spoke with pt.  Pt informed he is already on abx and to take mucinex extra strength one in am and one in pm.  He verbalized understanding.  Gweneth Dimitri RN  February 06, 2010 5:23 PM

## 2010-09-29 NOTE — Miscellaneous (Signed)
[facility]  Clinical Lists Changes  received labs from [facility] dated 03/25/2010 and put in [facility] very important look at folder for him to review.  Arman Filter LPN  March 27, 2010 4:23 PM

## 2010-09-29 NOTE — Medication Information (Signed)
Summary: Albuterol & Budesonide/[facility]  Albuterol & Budesonide/[facility]   Imported By: Sherian Rein 09/30/2009 07:48:54  _____________________________________________________________________  External Attachment:    Type:   Image     Comment:   External Document

## 2010-09-29 NOTE — Medication Information (Signed)
Summary: Furosemide/[facility]  Furosemide/[facility]   Imported By: Sherian Rein 04/21/2010 08:29:49  _____________________________________________________________________  External Attachment:    Type:   Image     Comment:   External Document

## 2010-09-29 NOTE — Miscellaneous (Signed)
Summary: Orders Update  Clinical Lists Changes  Problems: Added new problem of HYPERLIPIDEMIA (ICD-272.4) Orders: Added new Test order of TLB-Lipid Panel (80061-LIPID) - Signed

## 2010-10-01 NOTE — Progress Notes (Signed)
  Phone Note Call from Patient   Caller: Samuel Ball Call For: Samuel Ball Summary of Call: diarrehea still bad needs to talk to you [phone] Initial call taken by: Oneita Jolly,  September 10, 2010 9:20 AM  Follow-up for Phone Call        patient to come by and get sterile cup from lab for testing of stool please put in for ova and parasites, enteric pathogens, and clostridium dificile.   Follow-up by: Barbaraann Share MD,  September 10, 2010 9:49 AM  Additional Follow-up for Phone Call Additional follow up Details #1::        Order for Stool Specimen entered in [facility]. Abigail Miyamoto RN  September 10, 2010 10:37 AM

## 2010-10-01 NOTE — Progress Notes (Signed)
Summary: lab results  Phone Note Call from Patient Call back at Home Phone [phone]   Caller: Patient Call For: clance Reason for Call: Lab or Test Results Summary of Call: Requests lab results. Initial call taken by: Darletta Moll,  September 11, 2010 12:47 PM  Follow-up for Phone Call        kc call him about this result he said he can't take this anymore he wants to know should he go to ER Follow-up by: Oneita Jolly,  September 11, 2010 1:56 PM  Additional Follow-up for Phone Call Additional follow up Details #1::        I have discussed situation with him.  He is not in any distress, is not having fever, no bloody stool.  He is tired of going to bathroom.  He is eating soup and drinking a large quantity of gatorade.  I have reviewed the brat diet with him, and asked him to either dilute the gatorade or alternate with plain water.  Will contact him when tests are back.  He is to call if worsening symptoms over the weekend. Additional Follow-up by: Barbaraann Share MD,  September 11, 2010 2:16 PM

## 2010-10-01 NOTE — Progress Notes (Signed)
Summary: diarrhea  Phone Note Call from Patient   Caller: Samuel Ball Call For: [name] Summary of Call: pt has had diarrhea since thurs has used immodium and may be some better but not well yet need advise [phone] Initial call taken by: Oneita Jolly,  September 07, 2010 9:07 AM  Follow-up for Phone Call        Spoke with pt.  He c/o diarrhea x 3-4 days- better since this am.  He wanted to know why this was happening.  I advised that it could be a number of things, maybe something he ate, or a stomach virus.  He states that he started immodium last night and this helps.  I advised that he make sure he is drinking plenty of fluids so he does not get dehydrated, and stick to bland diet- BRAT  diet advised.  KC, do you have any further recs? Pls advise thanks! Follow-up by: Vernie Murders,  September 07, 2010 9:40 AM  Additional Follow-up for Phone Call Additional follow up Details #1::        He had diarrhea with no fever, sweats, blood, or significant abd pain.  Immodium is helping, and volume is way down.  Has not eaten anything bad, no recent abx.  I have asked him to push fluid, brat diet, and to call in 48 hrs if not improving.  sooner if getting worse. Additional Follow-up by: Barbaraann Share MD,  September 07, 2010 5:30 PM

## 2010-10-09 ENCOUNTER — Telehealth (INDEPENDENT_AMBULATORY_CARE_PROVIDER_SITE_OTHER): Payer: Self-pay | Admitting: *Deleted

## 2010-10-15 NOTE — Progress Notes (Signed)
Summary: out of meds  Phone Note Call from Patient   Caller: Patient Call For: clance Summary of Call: pt says [facility] told him they have been waiting for more than a week re: refills of budesonide (which he is out of). also ipratropium and albuterol sulfate. he needs this asap. pt # [id number] Initial call taken by: Tivis Ringer, CNA,  October 09, 2010 1:36 PM  Follow-up for Phone Call        called [facility] and gave verbal ok for refills on budesonide and duo nebs spoke to [facility] pt aware of this Follow-up by: Oneita Jolly,  October 09, 2010 2:01 PM

## 2010-11-16 LAB — URINALYSIS, ROUTINE W REFLEX MICROSCOPIC
Hgb urine dipstick: NEGATIVE
Leukocytes, UA: NEGATIVE
Nitrite: NEGATIVE
Protein, ur: 100 mg/dL — AB
Specific Gravity, Urine: 1.024 (ref 1.005–1.030)
Urobilinogen, UA: 1 mg/dL (ref 0.0–1.0)

## 2010-11-16 LAB — CBC
HCT: 47.2 % (ref 39.0–52.0)
HCT: 49.1 % (ref 39.0–52.0)
HCT: 50.2 % (ref 39.0–52.0)
Hemoglobin: 16.8 g/dL (ref 13.0–17.0)
MCHC: 33.9 g/dL (ref 30.0–36.0)
MCV: 89.5 fL (ref 78.0–100.0)
MCV: 91.3 fL (ref 78.0–100.0)
Platelets: 103 10*3/uL — ABNORMAL LOW (ref 150–400)
Platelets: 57 10*3/uL — ABNORMAL LOW (ref 150–400)
Platelets: 80 10*3/uL — ABNORMAL LOW (ref 150–400)
RBC: 5.5 MIL/uL (ref 4.22–5.81)
RDW: 13.5 % (ref 11.5–15.5)
RDW: 13.8 % (ref 11.5–15.5)
RDW: 13.9 % (ref 11.5–15.5)
WBC: 4.5 10*3/uL (ref 4.0–10.5)
WBC: 7.5 10*3/uL (ref 4.0–10.5)
WBC: 9.1 10*3/uL (ref 4.0–10.5)

## 2010-11-16 LAB — CARDIAC PANEL(CRET KIN+CKTOT+MB+TROPI)
CK, MB: UNDETERMINED ng/mL (ref 0.3–4.0)
Relative Index: 1.4 (ref 0.0–2.5)
Total CK: 804 U/L — ABNORMAL HIGH (ref 7–232)
Total CK: 882 U/L — ABNORMAL HIGH (ref 7–232)
Total CK: 975 U/L — ABNORMAL HIGH (ref 7–232)
Troponin I: 0.02 ng/mL (ref 0.00–0.06)
Troponin I: 0.02 ng/mL (ref 0.00–0.06)

## 2010-11-16 LAB — COMPREHENSIVE METABOLIC PANEL
ALT: 44 U/L (ref 0–53)
AST: 43 U/L — ABNORMAL HIGH (ref 0–37)
Alkaline Phosphatase: 75 U/L (ref 39–117)
CO2: 34 mEq/L — ABNORMAL HIGH (ref 19–32)
GFR calc Af Amer: >60 mL/min (ref >60)
GFR calc non Af Amer: >60 mL/min (ref >60)
Glucose, Bld: 126 mg/dL — ABNORMAL HIGH (ref 70–99)
Potassium: 4.2 mEq/L (ref 3.5–5.1)
Sodium: 138 mEq/L (ref 135–145)
Total Protein: 7.8 g/dL (ref 6.0–8.3)

## 2010-11-16 LAB — DIFFERENTIAL
Basophils Absolute: 0 10*3/uL (ref 0.0–0.1)
Basophils Relative: 0 % (ref 0–1)
Basophils Relative: 0 % (ref 0–1)
Eosinophils Absolute: 0 10*3/uL (ref 0.0–0.7)
Eosinophils Absolute: 0.1 10*3/uL (ref 0.0–0.7)
Eosinophils Relative: 0 % (ref 0–5)
Eosinophils Relative: 1 % (ref 0–5)
Lymphs Abs: 0.3 10*3/uL — ABNORMAL LOW (ref 0.7–4.0)
Monocytes Relative: 6 % (ref 3–12)
Neutrophils Relative %: 86 % — ABNORMAL HIGH (ref 43–77)
Neutrophils Relative %: 92 % — ABNORMAL HIGH (ref 43–77)

## 2010-11-16 LAB — URINE MICROSCOPIC-ADD ON

## 2010-11-16 LAB — BASIC METABOLIC PANEL
BUN: 30 mg/dL — ABNORMAL HIGH (ref 6–23)
Creatinine, Ser: 0.95 mg/dL (ref 0.4–1.5)
GFR calc non Af Amer: >60 mL/min (ref >60)
Glucose, Bld: 138 mg/dL — ABNORMAL HIGH (ref 70–99)
Potassium: 5.1 mEq/L (ref 3.5–5.1)

## 2010-11-16 LAB — BLOOD GAS, ARTERIAL
Acid-Base Excess: 5.2 mmol/L — ABNORMAL HIGH (ref 0.0–2.0)
Bicarbonate: 33 mEq/L — ABNORMAL HIGH (ref 20.0–24.0)
TCO2: 28.1 mmol/L (ref 0–100)
pCO2 arterial: 61.5 mmHg (ref 35.0–45.0)
pH, Arterial: 7.349 — ABNORMAL LOW (ref 7.350–7.450)
pO2, Arterial: 75.1 mmHg — ABNORMAL LOW (ref 80.0–100.0)

## 2010-11-16 LAB — SEDIMENTATION RATE: Sed Rate: 2 mm/hr (ref 0–16)

## 2010-11-16 LAB — CK TOTAL AND CKMB (NOT AT ARMC)
Relative Index: 3.1 — ABNORMAL HIGH (ref 0.0–2.5)
Total CK: 659 U/L — ABNORMAL HIGH (ref 7–232)

## 2010-11-16 LAB — CK: Total CK: 589 U/L — ABNORMAL HIGH (ref 7–232)

## 2010-11-16 LAB — BRAIN NATRIURETIC PEPTIDE: Pro B Natriuretic peptide (BNP): 105 pg/mL — ABNORMAL HIGH (ref 0.0–100.0)

## 2010-11-25 ENCOUNTER — Encounter: Payer: Self-pay | Admitting: Pulmonary Disease

## 2010-11-27 ENCOUNTER — Ambulatory Visit (INDEPENDENT_AMBULATORY_CARE_PROVIDER_SITE_OTHER): Payer: Medicare PPO | Admitting: Pulmonary Disease

## 2010-11-27 ENCOUNTER — Encounter: Payer: Self-pay | Admitting: Pulmonary Disease

## 2010-11-27 DIAGNOSIS — J438 Other emphysema: Secondary | ICD-10-CM

## 2010-11-27 DIAGNOSIS — E678 Other specified hyperalimentation: Secondary | ICD-10-CM

## 2010-11-27 DIAGNOSIS — J9610 Chronic respiratory failure, unspecified whether with hypoxia or hypercapnia: Secondary | ICD-10-CM

## 2010-11-27 NOTE — Patient Instructions (Signed)
No change in meds Work on weight loss Look at different mask choices:  resmed quattro FX Consider using continuous oxygen regulator on large tank when away from home for awhile.   followup with me in 4mos

## 2010-11-27 NOTE — Progress Notes (Signed)
  Subjective:    Patient ID: Samuel Ball, male    DOB: Oct 29, 1945, 65 y.o.   MRN: [id number]  HPI The pt comes in today for f/u of his multifactorial chronic respiratory failure.  He has known OHS, COPD, and biventricular failure.  He feels his breathing is at baseline.  He denies any congestion, cough, or purulence.  He has not seen worsening LE edema, but his weight is up 10 pounds from last visit.   Review of Systems  Constitutional: Positive for unexpected weight change. Negative for fever.  HENT: Positive for rhinorrhea and postnasal drip. Negative for ear pain, congestion, sore throat, sneezing, trouble swallowing and dental problem.   Eyes: Negative for redness.  Respiratory: Negative for cough, shortness of breath and wheezing.   Cardiovascular: Negative for chest pain, palpitations and leg swelling.  Gastrointestinal: Negative for nausea, abdominal pain and diarrhea.  Genitourinary: Negative for dysuria.  Musculoskeletal: Negative for joint swelling.  Skin: Negative for rash.  Neurological: Negative for syncope and headaches.  Hematological: Does not bruise/bleed easily.  Psychiatric/Behavioral: Negative for dysphoric mood. The patient is not nervous/anxious.        Objective:   Physical Exam Obese male in nad No skin breakdown or pressure necrosis from cpap mask Chest is totally clear.  No wheezing Distant but regular HR, no mrg No LE edema, no cyanosis  Alert, oriented, moves all 4        Assessment & Plan:

## 2010-11-27 NOTE — Assessment & Plan Note (Signed)
The pt has no evidence for bronchospasm or recent exacerbation.  He is to maintain on current meds.

## 2010-11-27 NOTE — Assessment & Plan Note (Signed)
The pt's respiratory issues are directly linked to his weight.  When he loses, his breathing and sats are much improved (and visa versa).  He is maintaining on bipap at night with oxygen.  His weight is up 10 pounds since his last visit, and this is reflected in his ambulatory sats on pulses oxygen.  I think he needs to be on continuous oxygen when away from home and ambulating.  I have discussed the various options with him. He must work on weight loss.

## 2010-12-23 ENCOUNTER — Other Ambulatory Visit: Payer: Self-pay | Admitting: *Deleted

## 2010-12-23 MED ORDER — FUROSEMIDE 40 MG PO TABS: 40.0000 mg | ORAL_TABLET | Freq: Every day | ORAL | Status: DC

## 2011-01-13 ENCOUNTER — Telehealth: Payer: Self-pay | Admitting: Pulmonary Disease

## 2011-01-13 NOTE — Telephone Encounter (Signed)
Please advise Dr. Shelle Iron if okay to do.

## 2011-01-13 NOTE — Telephone Encounter (Signed)
[facility] Dr. Kandis Cocking office.  They are closed for the day.  WCB tomorrow.

## 2011-01-13 NOTE — Telephone Encounter (Signed)
See if they will fax over his recent labs. Let him know will call once we get them

## 2011-01-14 NOTE — Telephone Encounter (Signed)
Received most recent lab results from Dr. Kandis Cocking office and put in [facility] very important look at folder for him to review.

## 2011-01-14 NOTE — Telephone Encounter (Signed)
Called and spoke with Jill Alexanders in Medical Records with Dr. Kandis Cocking office and requested labs.  Awaiting fax.

## 2011-01-15 NOTE — Telephone Encounter (Signed)
Have spoken with pt.  All labs are good except cpk is elevated again on lipitor.  I have asked him to discontinue all statins, and we will recheck cpk in 4 weeks. If still abnl, may need rheum referral.  Discussed with pt who has no further questions.

## 2011-02-24 ENCOUNTER — Ambulatory Visit (INDEPENDENT_AMBULATORY_CARE_PROVIDER_SITE_OTHER): Payer: Medicare PPO | Admitting: Pulmonary Disease

## 2011-02-24 ENCOUNTER — Other Ambulatory Visit (INDEPENDENT_AMBULATORY_CARE_PROVIDER_SITE_OTHER): Payer: Medicare PPO

## 2011-02-24 ENCOUNTER — Encounter: Payer: Self-pay | Admitting: Pulmonary Disease

## 2011-02-24 DIAGNOSIS — R252 Cramp and spasm: Secondary | ICD-10-CM | POA: Insufficient documentation

## 2011-02-24 DIAGNOSIS — J438 Other emphysema: Secondary | ICD-10-CM

## 2011-02-24 DIAGNOSIS — IMO0001 Reserved for inherently not codable concepts without codable children: Secondary | ICD-10-CM

## 2011-02-24 DIAGNOSIS — E678 Other specified hyperalimentation: Secondary | ICD-10-CM

## 2011-02-24 DIAGNOSIS — J9610 Chronic respiratory failure, unspecified whether with hypoxia or hypercapnia: Secondary | ICD-10-CM

## 2011-02-24 DIAGNOSIS — R0602 Shortness of breath: Secondary | ICD-10-CM

## 2011-02-24 LAB — BASIC METABOLIC PANEL
BUN: 21 mg/dL (ref 6–23)
CO2: 35 mEq/L — ABNORMAL HIGH (ref 19–32)
Calcium: 9.7 mg/dL (ref 8.4–10.5)
GFR: 88.75 mL/min (ref >60.00)
Glucose, Bld: 83 mg/dL (ref 70–99)

## 2011-02-24 LAB — PHOSPHORUS: Phosphorus: 3.1 mg/dL (ref 2.3–4.6)

## 2011-02-24 LAB — MAGNESIUM: Magnesium: 2.3 mg/dL (ref 1.5–2.5)

## 2011-02-24 LAB — CALCIUM: Calcium: 9.7 mg/dL (ref 8.4–10.5)

## 2011-02-24 NOTE — Progress Notes (Signed)
  Subjective:    Patient ID: Samuel Ball, male    DOB: Jun 15, 1946, 65 y.o.   MRN: [id number]  HPI The pt comes in today for f/u of his known chronic RF secondary to OHS/COPD/cor pulmonale.  He has been having trouble with elevated cpk's, and the working diagnosis was that it is related to statins.  Last visit his lipitor was d/ced, and he is due for a recheck.  He c/o muscle cramping, and has been taking quinine.  He also has issues with his right arm, but it is difficult to know whether this is due to shoulder issues vs muscle issue.  He states his breathing has been worse than usual, but nothing by history or exam to support copd exacerbation or decompensated heart failure.  He is remaining compliant on his pulmonary meds and bipap.    Review of Systems  Constitutional: Negative for fever and unexpected weight change.  HENT: Positive for congestion. Negative for ear pain, nosebleeds, sore throat, rhinorrhea, sneezing, trouble swallowing, dental problem, postnasal drip and sinus pressure.   Eyes: Negative for redness and itching.  Respiratory: Positive for chest tightness, shortness of breath and wheezing. Negative for cough.   Cardiovascular: Negative for palpitations and leg swelling.  Gastrointestinal: Negative for nausea and vomiting.  Genitourinary: Negative for dysuria.  Musculoskeletal: Negative for joint swelling.  Skin: Negative for rash.  Neurological: Negative for headaches.  Hematological: Bruises/bleeds easily.  Psychiatric/Behavioral: Negative for dysphoric mood. The patient is not nervous/anxious.        Objective:   Physical Exam Obese male in nad No skin breakdown or pressure necrosis from cpap mask Chest with decreased bs, no crackles or wheezes noted Cor with rrr LE without edema, or cyanosis Alert and oriented, moves all 4        Assessment & Plan:

## 2011-02-24 NOTE — Patient Instructions (Signed)
Will have [facility] change your flow meter on your oxygen Will check bloodwork today, and call you with results Work on weight loss, research "lap banding" and let me know if interested in attending the meeting.  Schedule followup with me in 3mos.

## 2011-02-26 ENCOUNTER — Other Ambulatory Visit: Payer: Self-pay | Admitting: Pulmonary Disease

## 2011-02-26 DIAGNOSIS — IMO0001 Reserved for inherently not codable concepts without codable children: Secondary | ICD-10-CM

## 2011-02-27 NOTE — Assessment & Plan Note (Addendum)
CPK 975 with platelet count 58K 01/2010.  Pt hospitalized and improved with d/c simvistatin.  Fell to 271, then later lipitor started because of very abnormal lipid profile and h/o cad. Labwork by Dr. Alanda Amass 12/2010 with CPK 565, but pt not symptomatic.  Lipitor d/c. CPK 02/24/11 was 753 off all statins, and pt c/o significant muscle cramping.  ?if worsening sob due to muscle weakness issues?

## 2011-02-27 NOTE — Assessment & Plan Note (Signed)
Pt is maintaining on his usual regimen for his moderate COPD, and there is nothing by history or exam to suggest acute exacerbation.

## 2011-03-25 ENCOUNTER — Ambulatory Visit: Payer: Medicare PPO | Admitting: Pulmonary Disease

## 2011-03-31 ENCOUNTER — Ambulatory Visit: Payer: Medicare PPO | Admitting: Pulmonary Disease

## 2011-05-11 ENCOUNTER — Other Ambulatory Visit: Payer: Self-pay | Admitting: Pulmonary Disease

## 2011-05-12 ENCOUNTER — Telehealth: Payer: Self-pay | Admitting: Pulmonary Disease

## 2011-05-12 MED ORDER — FUROSEMIDE 40 MG PO TABS: ORAL_TABLET | ORAL | Status: DC

## 2011-05-12 NOTE — Telephone Encounter (Signed)
This is a blank refill request.  Will sign off on message.

## 2011-05-12 NOTE — Telephone Encounter (Signed)
I spoke with pt and he states he needs his furosemide sent to [facility]. I advised pt rx would be sent and nothing further was needed

## 2011-05-14 ENCOUNTER — Telehealth: Payer: Self-pay | Admitting: Pulmonary Disease

## 2011-05-14 DIAGNOSIS — E678 Other specified hyperalimentation: Secondary | ICD-10-CM

## 2011-05-14 DIAGNOSIS — G4733 Obstructive sleep apnea (adult) (pediatric): Secondary | ICD-10-CM

## 2011-05-14 NOTE — Telephone Encounter (Signed)
Please put me an order in for this

## 2011-05-14 NOTE — Telephone Encounter (Signed)
[facility]: There is no way he needs 16/4 as his pressure.  4??? Lets get him an auto bipap without st for the next 2 weeks with download, and then we can order him a bipap st with the right pressure afterwards.  Thanks.

## 2011-05-14 NOTE — Telephone Encounter (Signed)
Order sent to [facility].  

## 2011-05-28 ENCOUNTER — Encounter: Payer: Self-pay | Admitting: Pulmonary Disease

## 2011-05-28 ENCOUNTER — Ambulatory Visit (INDEPENDENT_AMBULATORY_CARE_PROVIDER_SITE_OTHER): Payer: Medicare PPO | Admitting: Pulmonary Disease

## 2011-05-28 DIAGNOSIS — IMO0001 Reserved for inherently not codable concepts without codable children: Secondary | ICD-10-CM

## 2011-05-28 DIAGNOSIS — E678 Other specified hyperalimentation: Secondary | ICD-10-CM

## 2011-05-28 DIAGNOSIS — J9610 Chronic respiratory failure, unspecified whether with hypoxia or hypercapnia: Secondary | ICD-10-CM

## 2011-05-28 DIAGNOSIS — J438 Other emphysema: Secondary | ICD-10-CM

## 2011-05-28 NOTE — Assessment & Plan Note (Signed)
He appears to be stable from a COPD standpoint on his current regimen.

## 2011-05-28 NOTE — Assessment & Plan Note (Signed)
We are currently trying to optimize his pressure on bilevel, and we'll make adjustments once I receive his download.  He does not have mask issues currently

## 2011-05-28 NOTE — Progress Notes (Signed)
  Subjective:    Patient ID: Samuel Ball, male    DOB: Aug 23, 1946, 65 y.o.   MRN: [id number]  HPI The patient comes in today for followup of his chronic respiratory failure secondary to COPD as well as obesity hypoventilation.  He also has been having issues with muscle pain, and his CPKs have been elevated.  This was initially felt to be secondary to his statin, however his complaints continued despite discontinuation.  He was referred to rheumatology for possible polymyositis, however they feel this is unlikely.  He continues to have myalgias, although his most recent CPK was down to 500.  He feels that his breathing is near his usual baseline, and denies any chest congestion or purulent mucus.  He has not been having chest pain or increasing lower extremity edema.   Review of Systems  Constitutional: Negative for fever and unexpected weight change.  HENT: Negative for ear pain, nosebleeds, congestion, sore throat, rhinorrhea, sneezing, trouble swallowing, dental problem, postnasal drip and sinus pressure.   Eyes: Negative for redness and itching.  Respiratory: Positive for shortness of breath. Negative for cough, chest tightness and wheezing.   Cardiovascular: Negative for palpitations and leg swelling.  Gastrointestinal: Negative for nausea and vomiting.  Genitourinary: Negative for dysuria.  Musculoskeletal: Negative for joint swelling.  Skin: Negative for rash.  Neurological: Negative for headaches.  Hematological: Bruises/bleeds easily.  Psychiatric/Behavioral: Negative for dysphoric mood. The patient is not nervous/anxious.        Objective:   Physical Exam Morbidly obese male in no acute distress No skin breakdown or pressure necrosis from the CPAP mask Chest with decreased breath sounds, no wheezes or rhonchi Cardiac exam with regular rate and rhythm Lower extremities with no significant edema, no cyanosis Alert and oriented, moves all 4 extremities.       Assessment &  Plan:

## 2011-05-28 NOTE — Patient Instructions (Signed)
Will put labs in computer for next week Will get download from DME next week, and order your new machine Work on weight loss followup with me in 3mos.

## 2011-05-28 NOTE — Assessment & Plan Note (Signed)
I remain concerned the patient has some type of either muscle disease or autoimmune disease causing his problem.  Will recheck his CPK levels today.

## 2011-05-31 ENCOUNTER — Ambulatory Visit: Payer: Medicare PPO

## 2011-05-31 ENCOUNTER — Other Ambulatory Visit: Payer: Self-pay | Admitting: Pulmonary Disease

## 2011-05-31 DIAGNOSIS — N400 Enlarged prostate without lower urinary tract symptoms: Secondary | ICD-10-CM

## 2011-05-31 DIAGNOSIS — IMO0001 Reserved for inherently not codable concepts without codable children: Secondary | ICD-10-CM

## 2011-05-31 DIAGNOSIS — Z79899 Other long term (current) drug therapy: Secondary | ICD-10-CM

## 2011-05-31 DIAGNOSIS — R5381 Other malaise: Secondary | ICD-10-CM

## 2011-05-31 DIAGNOSIS — E782 Mixed hyperlipidemia: Secondary | ICD-10-CM

## 2011-05-31 DIAGNOSIS — R5383 Other fatigue: Secondary | ICD-10-CM

## 2011-05-31 LAB — CBC WITH DIFFERENTIAL/PLATELET
Basophils Absolute: 0 10*3/uL (ref 0.0–0.1)
Eosinophils Absolute: 0.7 10*3/uL (ref 0.0–0.7)
Hemoglobin: 16.3 g/dL (ref 13.0–17.0)
Lymphocytes Relative: 18.7 % (ref 12.0–46.0)
Lymphs Abs: 1.4 10*3/uL (ref 0.7–4.0)
MCHC: 33.2 g/dL (ref 30.0–36.0)
Neutro Abs: 4.8 10*3/uL (ref 1.4–7.7)
Platelets: 120 10*3/uL — ABNORMAL LOW (ref 150.0–400.0)
RDW: 15.4 % — ABNORMAL HIGH (ref 11.5–14.6)

## 2011-05-31 LAB — LDL CHOLESTEROL, DIRECT: Direct LDL: 171.3 mg/dL

## 2011-05-31 LAB — LIPID PANEL
Cholesterol: 246 mg/dL — ABNORMAL HIGH (ref 0–200)
HDL: 50.3 mg/dL (ref >39.00)
Total CHOL/HDL Ratio: 5
VLDL: 21.2 mg/dL (ref 0.0–40.0)

## 2011-05-31 LAB — CK: Total CK: 530 U/L — ABNORMAL HIGH (ref 7–232)

## 2011-05-31 LAB — COMPREHENSIVE METABOLIC PANEL
ALT: 35 U/L (ref 0–53)
Alkaline Phosphatase: 71 U/L (ref 39–117)
Sodium: 139 mEq/L (ref 135–145)
Total Bilirubin: 1 mg/dL (ref 0.3–1.2)
Total Protein: 6.8 g/dL (ref 6.0–8.3)

## 2011-06-03 LAB — ALDOLASE

## 2011-06-04 ENCOUNTER — Other Ambulatory Visit: Payer: Self-pay | Admitting: Pulmonary Disease

## 2011-06-04 DIAGNOSIS — E678 Other specified hyperalimentation: Secondary | ICD-10-CM

## 2011-06-05 LAB — ALDOLASE: Aldolase: 14.5 U/L — ABNORMAL HIGH (ref <=8.1)

## 2011-06-09 ENCOUNTER — Other Ambulatory Visit: Payer: Self-pay | Admitting: Pulmonary Disease

## 2011-06-09 DIAGNOSIS — IMO0001 Reserved for inherently not codable concepts without codable children: Secondary | ICD-10-CM

## 2011-08-27 ENCOUNTER — Ambulatory Visit: Payer: Medicare PPO | Admitting: Pulmonary Disease

## 2011-08-30 ENCOUNTER — Ambulatory Visit: Payer: Medicare PPO | Admitting: Pulmonary Disease

## 2011-09-16 ENCOUNTER — Ambulatory Visit: Payer: Medicare PPO | Admitting: Pulmonary Disease

## 2011-09-27 ENCOUNTER — Ambulatory Visit (INDEPENDENT_AMBULATORY_CARE_PROVIDER_SITE_OTHER): Payer: Medicare PPO | Admitting: Pulmonary Disease

## 2011-09-27 ENCOUNTER — Ambulatory Visit (INDEPENDENT_AMBULATORY_CARE_PROVIDER_SITE_OTHER)
Admission: RE | Admit: 2011-09-27 | Discharge: 2011-09-27 | Disposition: A | Discharge status: Home / Self Care | Payer: Medicare PPO | Source: Ambulatory Visit | Attending: Pulmonary Disease | Admitting: Pulmonary Disease

## 2011-09-27 ENCOUNTER — Encounter: Payer: Self-pay | Admitting: Pulmonary Disease

## 2011-09-27 DIAGNOSIS — J9610 Chronic respiratory failure, unspecified whether with hypoxia or hypercapnia: Secondary | ICD-10-CM

## 2011-09-27 DIAGNOSIS — IMO0001 Reserved for inherently not codable concepts without codable children: Secondary | ICD-10-CM

## 2011-09-27 DIAGNOSIS — J438 Other emphysema: Secondary | ICD-10-CM

## 2011-09-27 DIAGNOSIS — Z23 Encounter for immunization: Secondary | ICD-10-CM

## 2011-09-27 DIAGNOSIS — E678 Other specified hyperalimentation: Secondary | ICD-10-CM

## 2011-09-27 NOTE — Patient Instructions (Signed)
Try saline rinses if needed for nasal congestion Will see about an "oximizer" to enable higher oxygen delivery when away from home. Will review you notes from neurology, and decide if we need to refer you to a rheumatologist at tertiary center. No change in breathing medications.  Will check cxr today, and arrange a bubble echo with Dr. Alanda Amass. Schedule followup with me in 3mos, but we will be in communication about test results.

## 2011-09-27 NOTE — Progress Notes (Signed)
  Subjective:    Patient ID: Samuel Ball, male    DOB: Dec 20, 1945, 66 y.o.   MRN: [id number]  HPI The patient comes in today for followup of his chronic respiratory failure secondary to obesity hypoventilation syndrome and COPD.  He has continued to have significant dyspnea on exertion that is above his usual baseline, and has been having issues with falling oxygen saturations.  He is in the high 70s today walking, but is on pulsed oxygen.  He denies any cough, chest congestion, or wheezing.  He does not have increased work of breathing at rest.  He denies any lower extremity edema or pleuritic chest pain.  He denies any anginal type chest pain.   Review of Systems  Constitutional: Negative for fever and unexpected weight change.  HENT: Negative for ear pain, nosebleeds, congestion, sore throat, rhinorrhea, sneezing, trouble swallowing, dental problem, postnasal drip and sinus pressure.   Eyes: Negative for redness and itching.  Respiratory: Positive for shortness of breath. Negative for cough, chest tightness and wheezing.   Cardiovascular: Negative for palpitations and leg swelling.  Gastrointestinal: Negative for nausea and vomiting.  Genitourinary: Negative for dysuria.  Musculoskeletal: Negative for joint swelling.  Skin: Negative for rash.  Neurological: Negative for headaches.  Hematological: Does not bruise/bleed easily.  Psychiatric/Behavioral: Negative for dysphoric mood. The patient is not nervous/anxious.        Objective:   Physical Exam Morbidly obese male in no acute distress Nose without purulence or discharge noted, no skin breakdown or pressure necrosis from the CPAP mask Chest with decreased breath sounds, no wheezes or rhonchi.  Adequate air flow noted overall Cardiac exam with a regular rate and rhythm Lower extremities with minimal edema, no cyanosis Alert and oriented, moves all 4 extremities.       Assessment & Plan:

## 2011-09-29 ENCOUNTER — Other Ambulatory Visit: Payer: Self-pay | Admitting: Pulmonary Disease

## 2011-09-29 DIAGNOSIS — IMO0001 Reserved for inherently not codable concepts without codable children: Secondary | ICD-10-CM

## 2011-09-30 NOTE — Assessment & Plan Note (Addendum)
PFTS 1998:  Fev1 1.83 (52%), ratio 51 Unable to afford long acting bronchodilators.   The patient is doing well on his current bronchodilator regimen, and has no history to see if just a COPD exacerbation.  He has no cough or congestion.  I have asked him to continue on his current medications.

## 2011-09-30 NOTE — Assessment & Plan Note (Addendum)
CPK 975 with platelet count 58K 01/2010.  Pt hospitalized and improved with d/c simvistatin.  Fell to 271, then later lipitor started because of very abnormal lipid profile and h/o cad. Labwork by Dr. Alanda Amass 12/2010 with CPK 565, but pt not symptomatic.  Lipitor d/c. CPK 02/24/11 was 753 off all statins, and pt c/o significant muscle cramping.  EMG/neuro eval 07/2011:  EMG with no evidence of myopathy, bilat median neuropathies at wrist c/w carpal tunnel.   The pt has seen neurology and they feel he does not have some type of neuromuscular disease.  He has seen rheumatology here in town, but pt was not happy about the evaluation and requests a second opinion.  I agree with this.  Will refer to rheumatology at [facility].

## 2011-09-30 NOTE — Assessment & Plan Note (Addendum)
V/q 2011 without VTE Echo 01/2010: normal Lv fxn, diastolic dysfunction  The pt has chronic respiratory failure that is felt primarily due to his OHS and COPD.  He is very hypoxic today on pulsed oxygen after walking from waiting room, but is really not in any respiratory distress.  His sats have improved on continuous flow oxygen and rest.  I think he needs to be on continuous oxygen 24hrs/day.  He does not have as big an issue at home on his concentrator.  I also wonder whether he may have a shunt, and will order bubble echo to evaluate PA pressures again and r/o possible shunt.  There is nothing to suggest thromboembolic disease by history.

## 2011-09-30 NOTE — Assessment & Plan Note (Addendum)
+  chronic hypercarbia Bipap dependent at HS npsg 1998 with AHI 2/hr (does not have osa) Bilevel optimized to 13/10.   The patient has a new bilevel machine, and his pressure has been optimized.  He feels that he is doing well with this and reports no mask issues.

## 2011-10-06 ENCOUNTER — Other Ambulatory Visit: Payer: Self-pay | Admitting: Pulmonary Disease

## 2011-10-06 ENCOUNTER — Telehealth: Payer: Self-pay | Admitting: Pulmonary Disease

## 2011-10-06 DIAGNOSIS — Z5181 Encounter for therapeutic drug level monitoring: Secondary | ICD-10-CM

## 2011-10-06 DIAGNOSIS — J9610 Chronic respiratory failure, unspecified whether with hypoxia or hypercapnia: Secondary | ICD-10-CM

## 2011-10-06 NOTE — Telephone Encounter (Signed)
Megan, please let pt know that his echo showed that he may have too much fluid on board.  Also, when they did his bubble study, it appears that a very small quantity of bubbles went thru to the other side.  This suggests a very small shunt or "bypass circuit".  A number of people have this, but will just watch for now since it appears small.   I would like to try to increase his lasix to one in am AND pm for a few weeks to see if he feels better.  Will need to come by lab next tues or wed and have bmet done.

## 2011-10-06 NOTE — Telephone Encounter (Signed)
ONO on bilevel 13/10 and 4liters shows low sat of 67% and over 8hrs less than 89%.  Will increase EPAP (to work like PEEP), as well as oxygen flow to 6liters. Repeat ONO in one week. Discussed with pt, order sent to [facility].

## 2011-10-06 NOTE — Telephone Encounter (Signed)
Called and spoke with pt.  Informed him Echo and bubble study results and KC's response and recs. Pt will increased lasix to bid (which I have updated in pt's med list)  and will come in next Weds to have bmet drawn.  Order in computer. Pt also wanted KC to be aware he is seeing Dr. Alanda Amass tomorrow for a follow up.      Pt also wanted to know what ONO results showed.  KC, please advise of these results.  Thanks

## 2011-10-07 ENCOUNTER — Other Ambulatory Visit: Payer: Self-pay | Admitting: Pulmonary Disease

## 2011-10-07 DIAGNOSIS — E782 Mixed hyperlipidemia: Secondary | ICD-10-CM

## 2011-10-08 ENCOUNTER — Other Ambulatory Visit: Payer: Self-pay | Admitting: Pulmonary Disease

## 2011-10-08 MED ORDER — BUDESONIDE 0.5 MG/2ML IN SUSP: 0.5000 mg | Freq: Two times a day (BID) | RESPIRATORY_TRACT | Status: DC

## 2011-10-08 MED ORDER — IPRATROPIUM-ALBUTEROL 0.5-2.5 (3) MG/3ML IN SOLN: 3.0000 mL | Freq: Four times a day (QID) | RESPIRATORY_TRACT | Status: DC

## 2011-10-08 NOTE — Telephone Encounter (Signed)
Prescriptions for Budesonide and Duoneb sent electronically to [facility] in [place], [place].

## 2011-10-13 ENCOUNTER — Other Ambulatory Visit (INDEPENDENT_AMBULATORY_CARE_PROVIDER_SITE_OTHER): Payer: Medicare PPO

## 2011-10-13 DIAGNOSIS — Z5181 Encounter for therapeutic drug level monitoring: Secondary | ICD-10-CM

## 2011-10-13 DIAGNOSIS — E782 Mixed hyperlipidemia: Secondary | ICD-10-CM

## 2011-10-13 LAB — LIPID PANEL
Cholesterol: 220 mg/dL — ABNORMAL HIGH (ref 0–200)
HDL: 45.2 mg/dL (ref >39.00)
Total CHOL/HDL Ratio: 5
Triglycerides: 124 mg/dL (ref 0.0–149.0)
VLDL: 24.8 mg/dL (ref 0.0–40.0)

## 2011-10-13 LAB — BASIC METABOLIC PANEL
Chloride: 100 mEq/L (ref 96–112)
Potassium: 4.4 mEq/L (ref 3.5–5.1)

## 2011-10-20 ENCOUNTER — Telehealth: Payer: Self-pay | Admitting: Pulmonary Disease

## 2011-10-20 ENCOUNTER — Other Ambulatory Visit: Payer: Self-pay | Admitting: Pulmonary Disease

## 2011-10-20 DIAGNOSIS — E678 Other specified hyperalimentation: Secondary | ICD-10-CM

## 2011-12-20 ENCOUNTER — Encounter: Payer: Self-pay | Admitting: Pulmonary Disease

## 2011-12-20 ENCOUNTER — Ambulatory Visit (INDEPENDENT_AMBULATORY_CARE_PROVIDER_SITE_OTHER): Payer: Medicare PPO | Admitting: Pulmonary Disease

## 2011-12-20 VITALS — BP 120/76 | HR 85 | Temp 97.6°F | Ht 65.0 in | Wt 230.2 lb

## 2011-12-20 DIAGNOSIS — J9610 Chronic respiratory failure, unspecified whether with hypoxia or hypercapnia: Secondary | ICD-10-CM

## 2011-12-20 DIAGNOSIS — IMO0001 Reserved for inherently not codable concepts without codable children: Secondary | ICD-10-CM

## 2011-12-20 DIAGNOSIS — J438 Other emphysema: Secondary | ICD-10-CM

## 2011-12-20 DIAGNOSIS — E678 Other specified hyperalimentation: Secondary | ICD-10-CM

## 2011-12-20 NOTE — Patient Instructions (Signed)
Will get your last oxygen overnight study, and call you with results. Work on weight loss Stay on continuous oxygen as much as possible. followup with me in 3 mos.

## 2011-12-20 NOTE — Assessment & Plan Note (Signed)
The patient is stable from this standpoint, with no evidence for acute bronchospasm.  This has never really been a significant acute process for him

## 2011-12-20 NOTE — Progress Notes (Signed)
  Subjective:    Patient ID: Samuel Ball, male    DOB: 08/21/1946, 66 y.o.   MRN: [id number]  HPI Patient comes in today for followup of his multiple pulmonary issues.  He has obesity hypoventilation syndrome with diastolic dysfunction and COPD.  He has chronic hypoxemia that is out of proportion to his disease severity, and we have been trying to find an etiology for this.  He did have an echo with bubble study that suggested a tiny shunt, but does not appear to be clinically significant.  He also has intermittently elevated CPKs despite being off statins, and was recently seen at [facility] by rheumatology.  They did not feel that he had an autoimmune process.   Review of Systems  Constitutional: Negative for fever and unexpected weight change.  HENT: Negative for ear pain, nosebleeds, congestion, sore throat, rhinorrhea, sneezing, trouble swallowing, dental problem, postnasal drip and sinus pressure.   Eyes: Negative for redness and itching.  Respiratory: Positive for shortness of breath. Negative for cough, chest tightness and wheezing.   Cardiovascular: Negative for palpitations and leg swelling.  Gastrointestinal: Negative for nausea and vomiting.  Genitourinary: Negative for dysuria.  Musculoskeletal: Negative for joint swelling.  Skin: Positive for rash.  Neurological: Negative for headaches.  Hematological: Bruises/bleeds easily.  Psychiatric/Behavioral: Negative for dysphoric mood. The patient is not nervous/anxious.        Objective:   Physical Exam Morbidly obese male in nad No skin breakdown or pressure necrosis from cpap mask Chest totally clear to auscultation Cor with rrr LE with minimal edema, no cyanosis Alert and oriented, moves all 4.        Assessment & Plan:

## 2011-12-20 NOTE — Assessment & Plan Note (Signed)
The patient is continuing on his bilevel, but we have not received his followup overnight oximetry after increasing his pressures.  We'll get this from his DME, and make further changes to his pressures depending upon the results.

## 2012-02-01 ENCOUNTER — Other Ambulatory Visit: Payer: Self-pay | Admitting: Pulmonary Disease

## 2012-02-01 DIAGNOSIS — E678 Other specified hyperalimentation: Secondary | ICD-10-CM

## 2012-02-08 ENCOUNTER — Encounter: Payer: Self-pay | Admitting: Pulmonary Disease

## 2012-03-20 ENCOUNTER — Ambulatory Visit: Payer: Medicare PPO | Admitting: Pulmonary Disease

## 2012-03-28 ENCOUNTER — Ambulatory Visit (INDEPENDENT_AMBULATORY_CARE_PROVIDER_SITE_OTHER): Payer: Medicare PPO | Admitting: Pulmonary Disease

## 2012-03-28 ENCOUNTER — Encounter: Payer: Self-pay | Admitting: Pulmonary Disease

## 2012-03-28 VITALS — BP 118/62 | HR 74 | Temp 97.7°F | Ht 64.5 in | Wt 235.0 lb

## 2012-03-28 DIAGNOSIS — J9610 Chronic respiratory failure, unspecified whether with hypoxia or hypercapnia: Secondary | ICD-10-CM

## 2012-03-28 DIAGNOSIS — E678 Other specified hyperalimentation: Secondary | ICD-10-CM

## 2012-03-28 DIAGNOSIS — J438 Other emphysema: Secondary | ICD-10-CM

## 2012-03-28 NOTE — Assessment & Plan Note (Signed)
The patient has been wearing bilevel compliantly, but is having issues with mask leaking since his pressure has been increased.  I will send him to the sleep center for a formal fitting.  I have also encouraged him to work aggressively on weight loss.

## 2012-03-28 NOTE — Patient Instructions (Addendum)
Continue to work on weight loss Weigh every am after emptying your bladder, and let us know if weight increasing. Will set up with sleep center for you to have a mask fitting. Will add humidity bottle to your home concentrator. followup with me in 3mos.

## 2012-03-28 NOTE — Progress Notes (Signed)
  Subjective:    Patient ID: Samuel Ball, male    DOB: 1946-07-13, 66 y.o.   MRN: [id number]  HPI The patient today for followup of his multifactorial chronic respiratory failure.  He has known obesity hypoventilation syndrome, COPD, and diastolic dysfunction.  He requires high flow oxygen, and does better on continuous flow.  We have been making adjustments to his bilevel pressure in order to maintain adequate oxygen saturations at night.  Most recently, we have increased his pressure to 19/16, but he is now having issues with mask leaking.  He will need an improved fit at the sleep Center.  His weight is up about 5 pounds since his last visit, and unfortunately he has not been weighing daily in the mornings as I had asked.  He tends to get into trouble with volume overload, but does not manifest as lower extremity edema.  He denies any cough or congestion.   Review of Systems  Constitutional: Negative for fever and unexpected weight change.  HENT: Negative for ear pain, nosebleeds, congestion, sore throat, rhinorrhea, sneezing, trouble swallowing, dental problem, postnasal drip and sinus pressure.   Eyes: Negative for redness and itching.  Respiratory: Positive for shortness of breath. Negative for cough, chest tightness and wheezing.   Cardiovascular: Negative for palpitations and leg swelling.  Gastrointestinal: Negative for nausea and vomiting.  Genitourinary: Negative for dysuria.  Musculoskeletal: Negative for joint swelling.  Skin: Negative for rash.  Neurological: Negative for headaches.  Hematological: Does not bruise/bleed easily.  Psychiatric/Behavioral: Negative for dysphoric mood. The patient is not nervous/anxious.   All other systems reviewed and are negative.       Objective:   Physical Exam Morbidly obese male in no acute distress Nose without purulence or discharge noted No skin breakdown or pressure necrosis from the CPAP mask Chest with faint basilar crackles,  adequate air flow, no wheezes or rhonchi Cardiac exam is regular rate and rhythm Lower extremities without edema, no cyanosis Alert and oriented, moves all 4 extremities.       Assessment & Plan:

## 2012-03-28 NOTE — Assessment & Plan Note (Signed)
He is stable from a COPD standpoint with no recent acute exacerbation or pulmonary infection.

## 2012-03-29 ENCOUNTER — Ambulatory Visit (HOSPITAL_BASED_OUTPATIENT_CLINIC_OR_DEPARTMENT_OTHER): Payer: Medicare PPO | Attending: Pulmonary Disease

## 2012-03-29 DIAGNOSIS — E678 Other specified hyperalimentation: Secondary | ICD-10-CM

## 2012-03-31 ENCOUNTER — Other Ambulatory Visit: Payer: Self-pay | Admitting: Pulmonary Disease

## 2012-03-31 DIAGNOSIS — E678 Other specified hyperalimentation: Secondary | ICD-10-CM

## 2012-05-02 ENCOUNTER — Other Ambulatory Visit: Payer: Self-pay | Admitting: Pulmonary Disease

## 2012-05-02 ENCOUNTER — Telehealth: Payer: Self-pay | Admitting: Pulmonary Disease

## 2012-05-02 MED ORDER — FUROSEMIDE 40 MG PO TABS: 40.0000 mg | ORAL_TABLET | Freq: Every day | ORAL | Status: DC

## 2012-05-02 NOTE — Telephone Encounter (Signed)
Furosemide was refilled today Spoke with pt and notified that this was done. He verbalized understanding and states nothing further needed.

## 2012-05-02 NOTE — Telephone Encounter (Signed)
Received refill request from [facility] for furosemide 40mg  qd #90.  Pt last seen 03-28-12, upcoming ov 06-20-12.  Rx sent; MAR updated.

## 2012-06-20 ENCOUNTER — Encounter: Payer: Self-pay | Admitting: Pulmonary Disease

## 2012-06-20 ENCOUNTER — Ambulatory Visit (INDEPENDENT_AMBULATORY_CARE_PROVIDER_SITE_OTHER): Payer: Medicare PPO | Admitting: Pulmonary Disease

## 2012-06-20 VITALS — BP 130/80 | HR 86 | Temp 97.8°F | Ht 65.0 in | Wt 234.8 lb

## 2012-06-20 DIAGNOSIS — E678 Other specified hyperalimentation: Secondary | ICD-10-CM

## 2012-06-20 DIAGNOSIS — J438 Other emphysema: Secondary | ICD-10-CM

## 2012-06-20 DIAGNOSIS — J9610 Chronic respiratory failure, unspecified whether with hypoxia or hypercapnia: Secondary | ICD-10-CM

## 2012-06-20 MED ORDER — PREDNISONE 10 MG PO TABS: ORAL_TABLET | ORAL | Status: DC

## 2012-06-20 NOTE — Assessment & Plan Note (Signed)
The patient is having worsening shortness of breath over the last 2 weeks, but it is really unclear whether this has anything to do with his obstructive lung disease.  He does not have active wheezing on exam, but I will treat him with a prednisone pulse for about 10 days while we are trying to establish the etiology of his worsening symptoms.

## 2012-06-20 NOTE — Assessment & Plan Note (Signed)
The patient is having worsening dyspnea and continues to have very significant hypoxemia with exertion.  He definitely has multifactorial respiratory failure, but it is unclear why he is worse of the last 2 weeks.  His last echo suggested a small shunt, but he has not had a right heart cath or shunt run.  At this point, I think he should have this done, and we'll discuss with his cardiologist.

## 2012-06-20 NOTE — Progress Notes (Signed)
  Subjective:    Patient ID: Samuel Ball, male    DOB: [birth year], 66 y.o.   MRN: [id number]  HPI The patient comes in today for followup of his known chronic respiratory failure.  He has significant hypoxemia today on oxygen with exertion, but tells me that he has had to park far distance away.  He also needs to be on continuous oxygen, but uses pulsed for his appointments because he will otherwise run out of oxygen too quickly.  He has noticed a two-week history of worsening shortness of breath, but is nowhere near his worst.  He denies any cough, congestion, or purulent mucus.  He denies chest tightness or pressure, and has had no pleuritic chest pain.  He has had no worsening lower extremity edema or calf tenderness.  The patient did have an echo earlier in the year that suggested a small shunt of some type.  He has never had a right heart catheterization or shunt run.   Review of Systems  Constitutional: Negative for fever and unexpected weight change.  HENT: Positive for rhinorrhea and postnasal drip. Negative for ear pain, nosebleeds, congestion, sore throat, sneezing, trouble swallowing, dental problem and sinus pressure.   Eyes: Negative for redness and itching.  Respiratory: Positive for shortness of breath. Negative for cough, chest tightness and wheezing.   Cardiovascular: Negative for palpitations and leg swelling.  Gastrointestinal: Negative for nausea and vomiting.  Genitourinary: Negative for dysuria.  Musculoskeletal: Negative for joint swelling.  Skin: Negative for rash.  Neurological: Negative for headaches.  Hematological: Bruises/bleeds easily.  Psychiatric/Behavioral: Negative for dysphoric mood. The patient is not nervous/anxious.        Objective:   Physical Exam Obese male in no acute distress Nose without purulence or discharge noted Neck without lymphadenopathy or thyromegaly Chest with mild decrease in breath sounds, no wheezes or rhonchi Cardiac exam distant  but regular Abdomen benign Lower extremities with no significant edema or calf tenderness, no cyanosis Alert and oriented, moves all 4 extremities.       Assessment & Plan:

## 2012-06-20 NOTE — Patient Instructions (Addendum)
Stay on continuous oxygen as much as possible. Will treat with a course of prednisone for your recent worsening shortness of breath Will speak with Dr. Alanda Amass about considering a right heart cath.  Will contact you after this.

## 2012-06-21 ENCOUNTER — Inpatient Hospital Stay (HOSPITAL_COMMUNITY): Payer: Medicare PPO

## 2012-06-21 ENCOUNTER — Telehealth: Payer: Self-pay | Admitting: Pulmonary Disease

## 2012-06-21 ENCOUNTER — Inpatient Hospital Stay (HOSPITAL_COMMUNITY)
Admission: AD | Admit: 2012-06-21 | Discharge: 2012-06-23 | DRG: 189 | Disposition: A | Discharge status: Home / Self Care | Payer: Medicare PPO | Source: Ambulatory Visit | Attending: Pulmonary Disease | Admitting: Pulmonary Disease

## 2012-06-21 ENCOUNTER — Encounter (HOSPITAL_COMMUNITY): Payer: Self-pay | Admitting: General Practice

## 2012-06-21 DIAGNOSIS — R0902 Hypoxemia: Secondary | ICD-10-CM | POA: Diagnosis present

## 2012-06-21 DIAGNOSIS — E662 Morbid (severe) obesity with alveolar hypoventilation: Secondary | ICD-10-CM | POA: Diagnosis present

## 2012-06-21 DIAGNOSIS — Z7982 Long term (current) use of aspirin: Secondary | ICD-10-CM

## 2012-06-21 DIAGNOSIS — I5032 Chronic diastolic (congestive) heart failure: Secondary | ICD-10-CM | POA: Diagnosis present

## 2012-06-21 DIAGNOSIS — I5082 Biventricular heart failure: Secondary | ICD-10-CM | POA: Diagnosis present

## 2012-06-21 DIAGNOSIS — J438 Other emphysema: Secondary | ICD-10-CM | POA: Diagnosis present

## 2012-06-21 DIAGNOSIS — I2510 Atherosclerotic heart disease of native coronary artery without angina pectoris: Secondary | ICD-10-CM | POA: Diagnosis present

## 2012-06-21 DIAGNOSIS — Z79899 Other long term (current) drug therapy: Secondary | ICD-10-CM

## 2012-06-21 DIAGNOSIS — E785 Hyperlipidemia, unspecified: Secondary | ICD-10-CM | POA: Diagnosis present

## 2012-06-21 DIAGNOSIS — D45 Polycythemia vera: Secondary | ICD-10-CM | POA: Diagnosis present

## 2012-06-21 DIAGNOSIS — D751 Secondary polycythemia: Secondary | ICD-10-CM | POA: Diagnosis present

## 2012-06-21 DIAGNOSIS — I252 Old myocardial infarction: Secondary | ICD-10-CM

## 2012-06-21 DIAGNOSIS — J9620 Acute and chronic respiratory failure, unspecified whether with hypoxia or hypercapnia: Principal | ICD-10-CM | POA: Diagnosis present

## 2012-06-21 DIAGNOSIS — Z87891 Personal history of nicotine dependence: Secondary | ICD-10-CM

## 2012-06-21 DIAGNOSIS — J439 Emphysema, unspecified: Secondary | ICD-10-CM | POA: Diagnosis present

## 2012-06-21 DIAGNOSIS — I509 Heart failure, unspecified: Secondary | ICD-10-CM | POA: Diagnosis present

## 2012-06-21 DIAGNOSIS — J9610 Chronic respiratory failure, unspecified whether with hypoxia or hypercapnia: Secondary | ICD-10-CM | POA: Diagnosis present

## 2012-06-21 DIAGNOSIS — R252 Cramp and spasm: Secondary | ICD-10-CM

## 2012-06-21 DIAGNOSIS — Z23 Encounter for immunization: Secondary | ICD-10-CM

## 2012-06-21 DIAGNOSIS — G4730 Sleep apnea, unspecified: Secondary | ICD-10-CM | POA: Diagnosis present

## 2012-06-21 DIAGNOSIS — I5189 Other ill-defined heart diseases: Secondary | ICD-10-CM

## 2012-06-21 DIAGNOSIS — Z9861 Coronary angioplasty status: Secondary | ICD-10-CM

## 2012-06-21 DIAGNOSIS — I279 Pulmonary heart disease, unspecified: Secondary | ICD-10-CM | POA: Diagnosis present

## 2012-06-21 DIAGNOSIS — J9600 Acute respiratory failure, unspecified whether with hypoxia or hypercapnia: Secondary | ICD-10-CM | POA: Diagnosis present

## 2012-06-21 HISTORY — DX: Acute myocardial infarction, unspecified: I21.9

## 2012-06-21 HISTORY — DX: Heart failure, unspecified: I50.9

## 2012-06-21 HISTORY — DX: Sleep apnea, unspecified: G47.30

## 2012-06-21 LAB — COMPREHENSIVE METABOLIC PANEL
AST: 29 U/L (ref 0–37)
Albumin: 4.1 g/dL (ref 3.5–5.2)
Calcium: 10 mg/dL (ref 8.4–10.5)
Chloride: 98 mEq/L (ref 96–112)
Creatinine, Ser: 0.78 mg/dL (ref 0.50–1.35)

## 2012-06-21 LAB — CBC
MCH: 30.7 pg (ref 26.0–34.0)
MCV: 93.1 fL (ref 78.0–100.0)
Platelets: 131 10*3/uL — ABNORMAL LOW (ref 150–400)
RDW: 13.8 % (ref 11.5–15.5)
WBC: 7.3 10*3/uL (ref 4.0–10.5)

## 2012-06-21 LAB — CK TOTAL AND CKMB (NOT AT ARMC)
CK, MB: 10.7 ng/mL (ref 0.3–4.0)
Relative Index: 3.2 — ABNORMAL HIGH (ref 0.0–2.5)

## 2012-06-21 MED ORDER — ADULT MULTIVITAMIN W/MINERALS CH
1.0000 | ORAL_TABLET | Freq: Every day | ORAL | Status: DC
  Administered 2012-06-22 – 2012-06-23 (×2): 1 via ORAL
  Filled 2012-06-21 (×3): qty 1

## 2012-06-21 MED ORDER — IOHEXOL 350 MG/ML SOLN
100.0000 mL | Freq: Once | INTRAVENOUS | Status: AC | PRN
  Administered 2012-06-21: 100 mL via INTRAVENOUS

## 2012-06-21 MED ORDER — PNEUMOCOCCAL VAC POLYVALENT 25 MCG/0.5ML IJ INJ
0.5000 mL | INJECTION | INTRAMUSCULAR | Status: AC
  Filled 2012-06-21: qty 0.5

## 2012-06-21 MED ORDER — SODIUM CHLORIDE 0.9 % IJ SOLN: 3.0000 mL | INTRAMUSCULAR | Status: DC | PRN

## 2012-06-21 MED ORDER — IPRATROPIUM-ALBUTEROL 0.5-2.5 (3) MG/3ML IN SOLN: 3.0000 mL | Freq: Four times a day (QID) | RESPIRATORY_TRACT | Status: DC

## 2012-06-21 MED ORDER — HEPARIN SODIUM (PORCINE) 5000 UNIT/ML IJ SOLN
5000.0000 [IU] | Freq: Three times a day (TID) | INTRAMUSCULAR | Status: DC
  Administered 2012-06-21 – 2012-06-22 (×2): 5000 [IU] via SUBCUTANEOUS
  Filled 2012-06-21 (×5): qty 1

## 2012-06-21 MED ORDER — SODIUM CHLORIDE 0.9 % IV SOLN: 250.0000 mL | INTRAVENOUS | Status: DC | PRN

## 2012-06-21 MED ORDER — POLYETHYLENE GLYCOL 3350 17 G PO PACK
17.0000 g | PACK | Freq: Every day | ORAL | Status: DC | PRN
  Filled 2012-06-21: qty 1

## 2012-06-21 MED ORDER — FUROSEMIDE 40 MG PO TABS
40.0000 mg | ORAL_TABLET | Freq: Every day | ORAL | Status: DC
  Administered 2012-06-23: 40 mg via ORAL
  Filled 2012-06-21 (×3): qty 1

## 2012-06-21 MED ORDER — METHYLPREDNISOLONE SODIUM SUCC 40 MG IJ SOLR
40.0000 mg | Freq: Two times a day (BID) | INTRAMUSCULAR | Status: DC
  Administered 2012-06-21 – 2012-06-23 (×3): 40 mg via INTRAVENOUS
  Filled 2012-06-21 (×6): qty 1

## 2012-06-21 MED ORDER — ALBUTEROL SULFATE (5 MG/ML) 0.5% IN NEBU
2.5000 mg | INHALATION_SOLUTION | Freq: Four times a day (QID) | RESPIRATORY_TRACT | Status: DC
  Administered 2012-06-21 – 2012-06-22 (×3): 2.5 mg via RESPIRATORY_TRACT
  Filled 2012-06-21 (×3): qty 0.5

## 2012-06-21 MED ORDER — ONE-DAILY MULTI VITAMINS PO TABS: 1.0000 | ORAL_TABLET | Freq: Every day | ORAL | Status: DC

## 2012-06-21 MED ORDER — INFLUENZA VIRUS VACC SPLIT PF IM SUSP
0.5000 mL | INTRAMUSCULAR | Status: AC
  Filled 2012-06-21: qty 0.5

## 2012-06-21 MED ORDER — ENALAPRIL MALEATE 5 MG PO TABS
5.0000 mg | ORAL_TABLET | Freq: Every day | ORAL | Status: DC
  Administered 2012-06-22 – 2012-06-23 (×2): 5 mg via ORAL
  Filled 2012-06-21 (×3): qty 1

## 2012-06-21 MED ORDER — ATENOLOL 25 MG PO TABS
25.0000 mg | ORAL_TABLET | Freq: Every day | ORAL | Status: DC
  Administered 2012-06-22 – 2012-06-23 (×2): 25 mg via ORAL
  Filled 2012-06-21 (×3): qty 1

## 2012-06-21 MED ORDER — SODIUM CHLORIDE 0.9 % IJ SOLN
3.0000 mL | Freq: Two times a day (BID) | INTRAMUSCULAR | Status: DC
  Administered 2012-06-21: 3 mL via INTRAVENOUS

## 2012-06-21 MED ORDER — ASPIRIN EC 81 MG PO TBEC
81.0000 mg | DELAYED_RELEASE_TABLET | Freq: Every day | ORAL | Status: DC
  Administered 2012-06-22 – 2012-06-23 (×2): 81 mg via ORAL
  Filled 2012-06-21 (×3): qty 1

## 2012-06-21 MED ORDER — BUDESONIDE 0.5 MG/2ML IN SUSP
0.5000 mg | Freq: Two times a day (BID) | RESPIRATORY_TRACT | Status: DC
  Administered 2012-06-22 – 2012-06-23 (×3): 0.5 mg via RESPIRATORY_TRACT
  Filled 2012-06-21 (×6): qty 2

## 2012-06-21 MED ORDER — SODIUM CHLORIDE 0.9 % IJ SOLN
3.0000 mL | Freq: Two times a day (BID) | INTRAMUSCULAR | Status: DC
  Administered 2012-06-21 – 2012-06-23 (×4): 3 mL via INTRAVENOUS

## 2012-06-21 MED ORDER — ACETAMINOPHEN 325 MG PO TABS: 650.0000 mg | ORAL_TABLET | Freq: Four times a day (QID) | ORAL | Status: DC | PRN

## 2012-06-21 MED ORDER — ASPIRIN 81 MG PO TABS: 81.0000 mg | ORAL_TABLET | Freq: Every day | ORAL | Status: DC

## 2012-06-21 MED ORDER — IPRATROPIUM BROMIDE 0.02 % IN SOLN
0.5000 mg | Freq: Four times a day (QID) | RESPIRATORY_TRACT | Status: DC
  Administered 2012-06-21 – 2012-06-22 (×3): 0.5 mg via RESPIRATORY_TRACT
  Filled 2012-06-21 (×3): qty 2.5

## 2012-06-21 MED ORDER — ACETAMINOPHEN 650 MG RE SUPP: 650.0000 mg | Freq: Four times a day (QID) | RECTAL | Status: DC | PRN

## 2012-06-21 NOTE — H&P (Signed)
Name: Samuel Ball MRN: [id number] DOB: [birth year]    LOS: 0  [facility]   History of Present Illness: 66 y/o WM with PMH of chronic respiratory failure, COPD, OHS (on BiPap 17/14 as of 10/2011), CHF, Cor Pulmonale followed by Dr. Shelle Iron who was seen in Pulmonary office on 10/22 with a two week hx of worsening shortness of breath, nasal drainage and fatigue with exertion.  In office 10/22 was noted to be hypoxemic on O2, but he did not want to be admitted at that time-was Rx'd a prednisone taper but continued to worsen overnight with new light headedness and called office 10/23 with symptoms and was sent for direct admit.    Patient denies fevers, chills, nausea/vomiting, sputum production, chest pain, lower extremity swelling, rash.   Lines / Drains:  Cultures:  Antibiotics:  Tess / Events: 10/23 CT Chest>>>   Past Medical History  Diagnosis Date  . Polycythemia   . Obesity hypoventilation syndrome   . Cor pulmonale   . Emphysema of lung   . Respiratory failure, chronic     No past surgical history on file.  Prior to Admission medications   Medication Sig Start Date End Date Taking? Authorizing Provider  aspirin 81 MG tablet Take 81 mg by mouth daily.      Historical Provider, MD  atenolol (TENORMIN) 25 MG tablet Take 25 mg by mouth daily.      Historical Provider, MD  budesonide (PULMICORT) 0.5 MG/2ML nebulizer solution Take 2 mLs (0.5 mg total) by nebulization 2 (two) times daily. Use as directed 2 times a day 10/08/11 10/07/12  Barbaraann Share, MD  clobetasol cream (TEMOVATE) 0.05 % as directed. 12/09/11   Historical Provider, MD  enalapril (VASOTEC) 5 MG tablet Take 1 tablet by mouth daily. 05/19/11   Historical Provider, MD  furosemide (LASIX) 40 MG tablet Take 1 tablet (40 mg total) by mouth daily. 05/02/12   Barbaraann Share, MD  ipratropium-albuterol (DUONEB) 0.5-2.5 (3) MG/3ML SOLN Inhale 3 mLs into the lungs 4 (four) times daily. 10/08/11 10/07/12   Barbaraann Share, MD  Multiple Vitamin (MULTIVITAMIN) tablet Take 1 tablet by mouth daily.      Historical Provider, MD  predniSONE (DELTASONE) 10 MG tablet Take 4 tablets x 2 days then 3 tablets x 2 days then 2 tablets x 2 days then 1 tablet x 2 days then stop 06/20/12   Barbaraann Share, MD   Allergies Allergies  Allergen Reactions  . Lipitor (Atorvastatin Calcium)   . Simvastatin     REACTION: acute myositis   Family History Family History  Problem Relation Age of Onset  . Lung cancer Father    Social History  reports that he quit smoking about 12 years ago. His smoking use included Cigarettes. He has a 126 pack-year smoking history. He does not have any smokeless tobacco history on file. His alcohol and drug histories not on file.  Review Of Systems: See HPI.  All systems reviewed and are negative except for HPI.    Vital Signs: Temp:  [98.1 F (36.7 C)] 98.1 F (36.7 C) (10/23 1315) Pulse Rate:  [95] 95  (10/23 1315) Resp:  [22] 22  (10/23 1315) BP: (136)/(78) 136/78 mmHg (10/23 1315) SpO2:  [92 %] 92 % (10/23 1315) Weight:  [229 lb 8 oz (104.101 kg)] 229 lb 8 oz (104.101 kg) (10/23 1315)    Physical Examination: General: wdwn adult male in NAD Neuro: AAOx4, speech clear,  MAE CV: s1s2 rrr, no m/r/g PULM: resp's non-labored, lungs bilaterally diminished UJ:WJXBJ/YNWG, non-tender, bsx4 active Extremities: warm/dry, no edema, no rashes / lesions  Labs    CBC No results found for this basename: HGB:3,HCT:3,WBC:3,PLT:3 in the last 168 hours   BMET No results found for this basename: NA:5,K:2,CL:5,CO2:5,GLUCOSE:5,BUN:5,CREATININE:5,CALCIUM:5,MG:5,PHOS:5 in the last 168 hours  No results found for this basename: INR:5 in the last 168 hours  No results found for this basename: PHART:5,PCO2:5,PCO2ART:5,PO2:5,PO2ART:5,HCO3:5,TCO2:5,O2SAT:5 in the last 168 hours   Radiology: See above.    Assessment and Plan: Principal Problem:  *Acute respiratory failure Active  Problems:  HYPERLIPIDEMIA  OBESITY HYPOVENTILATION SYNDROME  POLYCYTHEMIA  RESPIRATORY FAILURE, CHRONIC  MYOSITIS  Hypoxemia   Acute on Chronic Respiratory Failure Hypoxemia OHS Emphysema Assessment: acute on chronic respiratory failure with associated worsening of hypoxemia, decreased activity tolerance, and shortness of breath.  Known hx of OHS with chronic hypercarbia on BiPap 17/14 with last adjustment in 10/2011.  Hx of abnormal ECHO with questionable shunt in past.    Plan: -continuous O2 -->has been at times on pulse O2, may not be able to continue this in the future given chronic O2 needs, likely needs continuous, pending shunt physiology? -solu-medrol IV, follow clinical course onthis -CT scan of chest evaluate etiology hypoxia, infiltrates -Cardiology Evaluation, do not have echo from outpt, but there where concerns off shunt? Bubbles? -role TEE? R/o asd, pfo -BiPap QHS with O2 to keep sats > 92% -evaluate for fixed shunt with observation sats with increased O2 -BDer's  Hx of Myositis Assessment: denies muscle aches, new symtpoms  Plan: -check CK  Polycythemia Assessment: Followed by Hematology.  Plan: -check CBC, follow platelts Likely related to chronic hypoxia, secondary polycythemia  Best practices / Disposition: -->Code Status: Full -->DVT Px: Heparin -->GI Px: none indicated -->Diet: PO  Canary Brim, NP-C [facility] Pgr: [phone]  06/21/2012, 2:26 PM   I have fully examined this patient and agree with above findings.    And edited in full  Mcarthur Rossetti. Tyson Alias, MD, FACP Pgr: [phone] [facility]

## 2012-06-21 NOTE — Telephone Encounter (Signed)
Will forward to KC as an FYI 

## 2012-06-21 NOTE — Consult Note (Addendum)
Agree with the NP/PA-C note as written.  I have reviewed the echocardiogram and report from our office which I originally read in 10/2011.  A saline microbubble contrast study was performed and did not show a direct intracardiac shunt from right to left. There were a few saline microbubbles that appeared late in the left heart consistent with most likely a small intrapulmonary shunt. These are extremely common in patients with cor pulmonale and pulmonary hypertension. The degree of shunt is very minimal and I doubt significant intrapulmonary shunting exists to explain his dyspnea. There does not appear to be a role for TEE based on the 2D echo. One could consider RHC if it is thought the pulmonary pressures are underestimated by echo, which is possible and to test pulmonary vascular reactivity.  Chrystie Nose, MD, [facility] Attending Cardiologist [facility]

## 2012-06-21 NOTE — Progress Notes (Signed)
CRITICAL VALUE ALERT  Critical value received:   CkMB 10.7  Date of notification:  06-21-12  Time of notification:  1520  Critical value read back:yes  Nurse who received alert:  ATT  MD notified (1st page):  1525  Time of first page:  1525  MD notified (2nd page): 1545   Time of second page: 1545  Responding MD:  Tyson Alias Time MD responded: 1550   MD stated will monitor up coming labs

## 2012-06-21 NOTE — Consult Note (Signed)
Reason for Consult: Review echo from February 2013  Requesting Physician: Dr Samuel Ball  HPI: This is a 65 y.o. male with a past medical history significant for CAD and COPD. He is followed by Dr Samuel Ball and Dr Samuel Ball. His CAD has been stable. He is admitted now for acute on chronic respiratory failure. We are asked to review an echo that was done Feb 2013 that showed "small intrahepatic or intrapulmonary shunt"   PMHx:  Past Medical History  Diagnosis Date  . Polycythemia   . Obesity hypoventilation syndrome   . Cor pulmonale   . Emphysema of lung   . Respiratory failure, chronic   . CHF (congestive heart failure)   . Myocardial infarction   . Shortness of breath   . Sleep apnea     uses cpap   Past Surgical History  Procedure Date  . Cardiac stents     FAMHx: Family History  Problem Relation Age of Onset  . Lung cancer Father     SOCHx:  reports that he quit smoking about 12 years ago. His smoking use included Cigarettes. He has a 126 pack-year smoking history. He has never used smokeless tobacco. He reports that he does not drink alcohol or use illicit drugs.  ALLERGIES: Allergies  Allergen Reactions  . Lipitor (Atorvastatin Calcium)     Unknown  . Simvastatin     REACTION: acute myositis    ROS: Pertinent items are noted in HPI.  HOME MEDICATIONS: Prescriptions prior to admission  Medication Sig Dispense Refill  . aspirin 81 MG tablet Take 81 mg by mouth daily.        Marland Kitchen atenolol (TENORMIN) 25 MG tablet Take 25 mg by mouth daily.        . budesonide (PULMICORT) 0.5 MG/2ML nebulizer solution Take 2 mLs (0.5 mg total) by nebulization 2 (two) times daily. Use as directed 2 times a day  120 mL  6  . enalapril (VASOTEC) 5 MG tablet Take 1 tablet by mouth daily.      . furosemide (LASIX) 40 MG tablet Take 1 tablet (40 mg total) by mouth daily.  90 tablet  3  . ipratropium-albuterol (DUONEB) 0.5-2.5 (3) MG/3ML SOLN Inhale 3 mLs into the lungs 4 (four) times daily.   360 mL  6  . Multiple Vitamin (MULTIVITAMIN) tablet Take 1 tablet by mouth daily.        . predniSONE (DELTASONE) 10 MG tablet Take 4 tablets x 2 days then 3 tablets x 2 days then 2 tablets x 2 days then 1 tablet x 2 days then stop  20 tablet  0    HOSPITAL MEDICATIONS: I have reviewed the patient's current medications.  VITALS: Blood pressure 133/75, pulse 88, temperature 97.7 F (36.5 C), temperature source Oral, resp. rate 21, height 5\' 5"  (1.651 m), weight 104.101 kg (229 lb 8 oz), SpO2 93.00%.  PHYSICAL EXAM: General appearance: alert, cooperative, no distress and morbidly obese Neck: no carotid bruit and no JVD Lungs: decreased breath sounds Heart: regular rate and rhythm and decreased heart sounds Abdomen: morbid obesity Extremities: no edema Pulses: 2+ and symmetric Skin: cool and dry Neurologic: Grossly normal  LABS: Results for orders placed during the hospital encounter of 06/21/12 (from the past 48 hour(s))  CBC     Status: Abnormal   Collection Time   06/21/12  2:35 PM      Component Value Range Comment   WBC 7.3  4.0 - 10.5 K/uL    RBC 5.93 (*)  4.22 - 5.81 MIL/uL    Hemoglobin 18.2 (*) 13.0 - 17.0 g/dL    HCT 16.1 (*) 09.6 - 52.0 %    MCV 93.1  78.0 - 100.0 fL    MCH 30.7  26.0 - 34.0 pg    MCHC 33.0  30.0 - 36.0 g/dL    RDW 04.5  40.9 - 81.1 %    Platelets 131 (*) 150 - 400 K/uL   COMPREHENSIVE METABOLIC PANEL     Status: Abnormal   Collection Time   06/21/12  2:35 PM      Component Value Range Comment   Sodium 139  135 - 145 mEq/L    Potassium 4.8  3.5 - 5.1 mEq/L    Chloride 98  96 - 112 mEq/L    CO2 31  19 - 32 mEq/L    Glucose, Bld 129 (*) 70 - 99 mg/dL    BUN 17  6 - 23 mg/dL    Creatinine, Ser 9.14  0.50 - 1.35 mg/dL    Calcium 78.2  8.4 - 10.5 mg/dL    Total Protein 7.7  6.0 - 8.3 g/dL    Albumin 4.1  3.5 - 5.2 g/dL    AST 29  0 - 37 U/L    ALT 32  0 - 53 U/L    Alkaline Phosphatase 74  39 - 117 U/L    Total Bilirubin 1.1  0.3 - 1.2 mg/dL      GFR calc non Af Amer >90  >90 mL/min    GFR calc Af Amer >90  >90 mL/min   CK TOTAL AND CKMB     Status: Abnormal   Collection Time   06/21/12  2:35 PM      Component Value Range Comment   Total CK 332 (*) 7 - 232 U/L    CK, MB 10.7 (*) 0.3 - 4.0 ng/mL    Relative Index 3.2 (*) 0.0 - 2.5     IMAGING: Ct Angio Chest Pe W/cm &/or Wo Cm  06/21/2012  *RADIOLOGY REPORT*  Clinical Data: Shortness of breath.  Hypoxia.  CT ANGIOGRAPHY CHEST  Technique:  Multidetector CT imaging of the chest using the standard protocol during bolus administration of intravenous contrast. Multiplanar reconstructed images including MIPs were obtained and reviewed to evaluate the vascular anatomy.  Contrast: OMNIPAQUE IOHEXOL 350 MG/ML SOLN  Comparison: Multiple exams, including 02/02/2010  Findings: No filling defect is identified in the pulmonary arterial tree to suggest pulmonary embolus.  No aortic dissection or acute aortic abnormality observed. Coronary artery atherosclerosis noted.  Multiple small gallstones present in the gallbladder.  Thoracic spondylosis noted.  Hypoplastic T1 ribs noted.  Prominent emphysema noted.  Breathing motion artifact noted in the lung bases, minimally reducing diagnostic sensitivity and specificity.  Mild scarring or subsegmental atelectasis noted anteriorly in the left upper lobe.  IMPRESSION:  1. No filling defect is identified in the pulmonary arterial tree to suggest pulmonary embolus. 2.  Coronary artery atherosclerosis. 3.  Cholelithiasis. 4.  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 5.  Prominent emphysema.   Original Report Authenticated By: Samuel Ball, M.D.     IMPRESSION: Principal Problem:  *Acute respiratory failure Active Problems:  RESPIRATORY FAILURE, CHRONIC  Hypoxemia  OBESITY HYPOVENTILATION SYNDROME, bi-pap at night  EMPHYSEMA  HYPERLIPIDEMIA  POLYCYTHEMIA  MYOSITIS, Rheum w/u at [facility] Feb 2013  Diastolic dysfunction, grade 1 by echo Feb 2013  CAD,  turned down for CABG in 1998. RCA and CFX PCI then followed  by BMS to LAD in 1999. Myoview low risk 7/11   RECOMMENDATION: MD to review echo.  Time Spent Directly with Patient: 40 minutes  Samuel Ball K 06/21/2012, 5:33 PM

## 2012-06-21 NOTE — Progress Notes (Signed)
Utilization Review Completed.  

## 2012-06-22 ENCOUNTER — Encounter (HOSPITAL_COMMUNITY)
Admission: AD | Disposition: A | Discharge status: Home / Self Care | Payer: Self-pay | Source: Ambulatory Visit | Attending: Pulmonary Disease

## 2012-06-22 DIAGNOSIS — D751 Secondary polycythemia: Secondary | ICD-10-CM

## 2012-06-22 DIAGNOSIS — J438 Other emphysema: Secondary | ICD-10-CM

## 2012-06-22 DIAGNOSIS — I519 Heart disease, unspecified: Secondary | ICD-10-CM

## 2012-06-22 HISTORY — PX: LEFT AND RIGHT HEART CATHETERIZATION WITH CORONARY ANGIOGRAM: SHX5449

## 2012-06-22 LAB — POCT I-STAT 3, VENOUS BLOOD GAS (G3P V)
Acid-Base Excess: 7 mmol/L — ABNORMAL HIGH (ref 0.0–2.0)
Acid-Base Excess: 7 mmol/L — ABNORMAL HIGH (ref 0.0–2.0)
Bicarbonate: 35.6 mEq/L — ABNORMAL HIGH (ref 20.0–24.0)
Bicarbonate: 35.7 mEq/L — ABNORMAL HIGH (ref 20.0–24.0)
Bicarbonate: 35.1 mEq/L — ABNORMAL HIGH (ref 20.0–24.0)
Bicarbonate: 36 mEq/L — ABNORMAL HIGH (ref 20.0–24.0)
O2 Saturation: 67 %
O2 Saturation: 58 %
O2 Saturation: 66 %
TCO2: 38 mmol/L (ref 0–100)
TCO2: 37 mmol/L (ref 0–100)
pCO2, Ven: 61.9 mmHg — ABNORMAL HIGH (ref 45.0–50.0)
pCO2, Ven: 63.6 mmHg — ABNORMAL HIGH (ref 45.0–50.0)
pCO2, Ven: 61.2 mmHg — ABNORMAL HIGH (ref 45.0–50.0)
pH, Ven: 7.349 — ABNORMAL HIGH (ref 7.250–7.300)
pH, Ven: 7.357 — ABNORMAL HIGH (ref 7.250–7.300)
pH, Ven: 7.364 — ABNORMAL HIGH (ref 7.250–7.300)
pO2, Ven: 38 mmHg (ref 30.0–45.0)
pO2, Ven: 39 mmHg (ref 30.0–45.0)

## 2012-06-22 LAB — POCT I-STAT 3, ART BLOOD GAS (G3+)
O2 Saturation: 91 %
TCO2: 34 mmol/L (ref 0–100)
pH, Arterial: 7.371 (ref 7.350–7.450)

## 2012-06-22 SURGERY — LEFT AND RIGHT HEART CATHETERIZATION WITH CORONARY ANGIOGRAM
Anesthesia: LOCAL

## 2012-06-22 MED ORDER — SODIUM CHLORIDE 0.9 % IV SOLN: 250.0000 mL | INTRAVENOUS | Status: DC

## 2012-06-22 MED ORDER — IPRATROPIUM BROMIDE 0.02 % IN SOLN
0.5000 mg | Freq: Four times a day (QID) | RESPIRATORY_TRACT | Status: DC
  Administered 2012-06-22 – 2012-06-23 (×4): 0.5 mg via RESPIRATORY_TRACT
  Filled 2012-06-22 (×4): qty 2.5

## 2012-06-22 MED ORDER — ALBUTEROL SULFATE (5 MG/ML) 0.5% IN NEBU
INHALATION_SOLUTION | RESPIRATORY_TRACT | Status: AC
  Filled 2012-06-22: qty 0.5

## 2012-06-22 MED ORDER — ONDANSETRON HCL 4 MG/2ML IJ SOLN: 4.0000 mg | Freq: Four times a day (QID) | INTRAMUSCULAR | Status: DC | PRN

## 2012-06-22 MED ORDER — LIDOCAINE HCL (PF) 1 % IJ SOLN
INTRAMUSCULAR | Status: AC
  Filled 2012-06-22: qty 30

## 2012-06-22 MED ORDER — HEPARIN (PORCINE) IN NACL 2-0.9 UNIT/ML-% IJ SOLN
INTRAMUSCULAR | Status: AC
  Filled 2012-06-22: qty 1500

## 2012-06-22 MED ORDER — SODIUM CHLORIDE 0.9 % IJ SOLN: 3.0000 mL | Freq: Two times a day (BID) | INTRAMUSCULAR | Status: DC

## 2012-06-22 MED ORDER — DIAZEPAM 5 MG PO TABS: 5.0000 mg | ORAL_TABLET | ORAL | Status: DC

## 2012-06-22 MED ORDER — ALBUTEROL SULFATE (5 MG/ML) 0.5% IN NEBU
2.5000 mg | INHALATION_SOLUTION | RESPIRATORY_TRACT | Status: DC | PRN
Start: 2012-06-22 — End: 2012-06-23
  Administered 2012-06-22 – 2012-06-23 (×3): 2.5 mg via RESPIRATORY_TRACT
  Filled 2012-06-22 (×2): qty 0.5

## 2012-06-22 MED ORDER — SODIUM CHLORIDE 0.9 % IV SOLN
INTRAVENOUS | Status: DC
  Administered 2012-06-22: 10:00:00 via INTRAVENOUS

## 2012-06-22 MED ORDER — ACETAMINOPHEN 325 MG PO TABS: 650.0000 mg | ORAL_TABLET | ORAL | Status: DC | PRN

## 2012-06-22 MED ORDER — SODIUM CHLORIDE 0.9 % IV SOLN: 1.0000 mL/kg/h | INTRAVENOUS | Status: DC

## 2012-06-22 MED ORDER — SODIUM CHLORIDE 0.9 % IJ SOLN: 3.0000 mL | INTRAMUSCULAR | Status: DC | PRN

## 2012-06-22 MED ORDER — NITROGLYCERIN 0.2 MG/ML ON CALL CATH LAB
INTRAVENOUS | Status: AC
  Filled 2012-06-22: qty 1

## 2012-06-22 MED ORDER — VERAPAMIL HCL 2.5 MG/ML IV SOLN
INTRAVENOUS | Status: AC
  Filled 2012-06-22: qty 2

## 2012-06-22 MED ORDER — ENOXAPARIN SODIUM 40 MG/0.4ML ~~LOC~~ SOLN: 40.0000 mg | SUBCUTANEOUS | Status: DC

## 2012-06-22 MED ORDER — ALBUTEROL SULFATE (5 MG/ML) 0.5% IN NEBU
2.5000 mg | INHALATION_SOLUTION | Freq: Four times a day (QID) | RESPIRATORY_TRACT | Status: DC
  Administered 2012-06-22 – 2012-06-23 (×4): 2.5 mg via RESPIRATORY_TRACT
  Filled 2012-06-22 (×4): qty 0.5

## 2012-06-22 NOTE — Progress Notes (Signed)
Pt w/ Student nurse. Teacher and charge wanted to Hold Lasix  And mutIV until after cath. Flu and Pneum. hold until after Cath. I agreed with there choice. Will continue to monitor

## 2012-06-22 NOTE — Progress Notes (Signed)
Pt. Seen and examined. Agree with the NP/PA-C note as written. I spoke with Mr. Gillingham about his symptoms, although he denies angina, he is having increasing shortness of breath and he has a history of CAD and prior BMS to the LAD as well as stents in the RCA and LCx. CK is elevated, however troponin is not available, but this could suggest ischemia. Will plan LHC/RHC with coronary evaluation today. He is agreeable. Renal function appears normal. Plan for right radial/brachial approach.  Chrystie Nose, MD, [facility] Attending Cardiologist [facility]

## 2012-06-22 NOTE — Progress Notes (Signed)
Subjective: The pt is stable overnight, but c/o various issues with his medication schedule.  CT chest shows no PE or other acute process  Objective: Vital signs in last 24 hours: Blood pressure 126/82, pulse 75, temperature 97.3 F (36.3 C), temperature source Oral, resp. rate 18, height 5\' 5"  (1.651 m), weight 231 lb 4.2 oz (104.9 kg), SpO2 92.00%.  Intake/Output from previous day: 10/23 0701 - 10/24 0700 In: 220 [P.O.:220] Out: 300 [Urine:300]   Physical Exam:   obese male in nad Nose without purulence or discharge No skin breakdown or pressure necrosis from cpap mask Neck without LN, TMG Chest with decreased bs, a few basilar crackles, no wheezing Cor with distant HS, regular LE with no significant edema, no cyanosis Alert and oriented, moves all 4.    Lab Results:  Basename 06/21/12 1435  WBC 7.3  HGB 18.2*  HCT 55.2*  PLT 131*   BMET  Basename 06/21/12 1435  NA 139  K 4.8  CL 98  CO2 31  GLUCOSE 129*  BUN 17  CREATININE 0.78  CALCIUM 10.0    Studies/Results: Ct Angio Chest Pe W/cm &/or Wo Cm  06/21/2012  *RADIOLOGY REPORT*  Clinical Data: Shortness of breath.  Hypoxia.  CT ANGIOGRAPHY CHEST  Technique:  Multidetector CT imaging of the chest using the standard protocol during bolus administration of intravenous contrast. Multiplanar reconstructed images including MIPs were obtained and reviewed to evaluate the vascular anatomy.  Contrast: OMNIPAQUE IOHEXOL 350 MG/ML SOLN  Comparison: Multiple exams, including 02/02/2010  Findings: No filling defect is identified in the pulmonary arterial tree to suggest pulmonary embolus.  No aortic dissection or acute aortic abnormality observed. Coronary artery atherosclerosis noted.  Multiple small gallstones present in the gallbladder.  Thoracic spondylosis noted.  Hypoplastic T1 ribs noted.  Prominent emphysema noted.  Breathing motion artifact noted in the lung bases, minimally reducing diagnostic sensitivity and  specificity.  Mild scarring or subsegmental atelectasis noted anteriorly in the left upper lobe.  IMPRESSION:  1. No filling defect is identified in the pulmonary arterial tree to suggest pulmonary embolus. 2.  Coronary artery atherosclerosis. 3.  Cholelithiasis. 4.  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 5.  Prominent emphysema.   Original Report Authenticated By: Dellia Cloud, M.D.     Assessment/Plan: Patient Active Hospital Problem List:  Acute/chronic respiratory failure: The pt has worsening hypoxemia that is out of proportion to his various disease states.  I am concerned that he either has a shunt that we have not recognized, or he has pulmonary htn that is out of proportion to his known disease and may benefit from additional therapy.  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  He needs a RHC, and I have discussed this am with cardiology.  Will also allow [place] to see how much of an issue his diastolic dysfunction may be, and whether he needs more aggressive diuresis or change in cardiac meds.   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 His serum bicarbonate is at a stable level, and therefore suspect his hypercarbia is stable as well.  Continue on bilevel.    POLYCYTHEMIA (07/01/2007) Felt secondary to his chronic hypoxemia.  Had a w/u many years ago for P.Vera.     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  His history is NOT suggestive of an acute exacerbation.  Continue on  BD, and emperic steroids.      Barbaraann Share, M.D. 06/22/2012, 8:44 AM

## 2012-06-22 NOTE — Progress Notes (Signed)
[facility]  Subjective: SOB  Objective: Vital signs in last 24 hours: Temp:  [97.2 F (36.2 C)-98.1 F (36.7 C)] 97.3 F (36.3 C) (10/24 0458) Pulse Rate:  [75-95] 95  (10/24 1040) Resp:  [18-22] 18  (10/24 0458) BP: (117-136)/(67-82) 120/77 mmHg (10/24 1053) SpO2:  [92 %-97 %] 92 % (10/24 0800) Weight:  [104.101 kg (229 lb 8 oz)-104.9 kg (231 lb 4.2 oz)] 104.9 kg (231 lb 4.2 oz) (10/24 0458) Last BM Date: 06/21/12  Intake/Output from previous day: 10/23 0701 - 10/24 0700 In: 220 [P.O.:220] Out: 300 [Urine:300] Intake/Output this shift: Total I/O In: 3 [I.V.:3] Out: -   Medications Current Facility-Administered Medications  Medication Dose Route Frequency Provider Last Rate Last Dose  . 0.9 %  sodium chloride infusion   Intravenous Continuous Abelino Derrick, [place] 50 mL/hr at 06/22/12 0941    . acetaminophen (TYLENOL) tablet 650 mg  650 mg Oral Q6H PRN Bernadene Person, NP       Or  . acetaminophen (TYLENOL) suppository 650 mg  650 mg Rectal Q6H PRN Bernadene Person, NP      . albuterol (PROVENTIL) (5 MG/ML) 0.5% nebulizer solution 2.5 mg  2.5 mg Nebulization Q6H Barbaraann Share, MD      . albuterol (PROVENTIL) (5 MG/ML) 0.5% nebulizer solution 2.5 mg  2.5 mg Nebulization Q4H PRN Barbaraann Share, MD      . aspirin EC tablet 81 mg  81 mg Oral Daily Barbaraann Share, MD   81 mg at 06/22/12 1054  . atenolol (TENORMIN) tablet 25 mg  25 mg Oral Daily Bernadene Person, NP   25 mg at 06/22/12 1053  . budesonide (PULMICORT) nebulizer solution 0.5 mg  0.5 mg Nebulization BID Bernadene Person, NP   0.5 mg at 06/22/12 0810  . diazepam (VALIUM) tablet 5 mg  5 mg Oral On Call Abelino Derrick, [place]      . enalapril (VASOTEC) tablet 5 mg  5 mg Oral Daily Bernadene Person, NP   5 mg at 06/22/12 1053  . furosemide (LASIX) tablet 40 mg  40 mg Oral Daily Bernadene Person, NP      . influenza  inactive virus vaccine (FLUZONE/FLUARIX) injection 0.5 mL   0.5 mL Intramuscular Tomorrow-1000 Barbaraann Share, MD      . iohexol (OMNIPAQUE) 350 MG/ML injection 100 mL  100 mL Intravenous Once PRN Medication Radiologist, MD   100 mL at 06/21/12 1642  . ipratropium (ATROVENT) nebulizer solution 0.5 mg  0.5 mg Nebulization Q6H Barbaraann Share, MD      . methylPREDNISolone sodium succinate (SOLU-MEDROL) 40 mg/mL injection 40 mg  40 mg Intravenous Q12H Bernadene Person, NP   40 mg at 06/22/12 0610  . multivitamin with minerals tablet 1 tablet  1 tablet Oral Daily Barbaraann Share, MD      . pneumococcal 23 valent vaccine (PNU-IMMUNE) injection 0.5 mL  0.5 mL Intramuscular Tomorrow-1000 Barbaraann Share, MD      . polyethylene glycol (MIRALAX / GLYCOLAX) packet 17 g  17 g Oral Daily PRN Bernadene Person, NP      . sodium chloride 0.9 % injection 3 mL  3 mL Intravenous Q12H Bernadene Person, NP   3 mL at 06/22/12 1054  . DISCONTD: 0.9 %  sodium chloride infusion  250 mL Intravenous PRN Bernadene Person, NP      . DISCONTD: albuterol (PROVENTIL) (5 MG/ML)  0.5% nebulizer solution 2.5 mg  2.5 mg Nebulization QID Barbaraann Share, MD   2.5 mg at 06/22/12 0810  . DISCONTD: aspirin tablet 81 mg  81 mg Oral Daily Bernadene Person, NP      . DISCONTD: enoxaparin (LOVENOX) injection 40 mg  40 mg Subcutaneous Q24H Barbaraann Share, MD      . DISCONTD: heparin injection 5,000 Units  5,000 Units Subcutaneous Q8H Bernadene Person, NP   5,000 Units at 06/22/12 [phone]  . DISCONTD: ipratropium (ATROVENT) nebulizer solution 0.5 mg  0.5 mg Nebulization QID Barbaraann Share, MD   0.5 mg at 06/22/12 0809  . DISCONTD: ipratropium-albuterol (DUONEB) 0.5-2.5 (3) MG/3ML nebulizer solution 3 mL  3 mL Inhalation QID Bernadene Person, NP      . DISCONTD: multivitamin tablet 1 tablet  1 tablet Oral Daily Bernadene Person, NP      . DISCONTD: sodium chloride 0.9 % injection 3 mL  3 mL Intravenous Q12H Bernadene Person, NP   3 mL at 06/21/12 2148  . DISCONTD: sodium  chloride 0.9 % injection 3 mL  3 mL Intravenous PRN Bernadene Person, NP        PE: General appearance: alert, cooperative and no distress Lungs: Prolonged expiratory phase.  No wheeze or rhonchi Heart: regular rate and rhythm, S1, S2 normal, no murmur, click, rub or gallop Extremities: No LEE Pulses: 2+ and symmetric Neurologic: Grossly normal  Lab Results:   Basename 06/21/12 1435  WBC 7.3  HGB 18.2*  HCT 55.2*  PLT 131*   BMET  Basename 06/21/12 1435  NA 139  K 4.8  CL 98  CO2 31  GLUCOSE 129*  BUN 17  CREATININE 0.78  CALCIUM 10.0   CT ANGIOGRAPHY CHEST  Technique: Multidetector CT imaging of the chest using the  standard protocol during bolus administration of intravenous  contrast. Multiplanar reconstructed images including MIPs were  obtained and reviewed to evaluate the vascular anatomy.  Contrast: OMNIPAQUE IOHEXOL 350 MG/ML SOLN  Comparison: Multiple exams, including 02/02/2010  Findings: No filling defect is identified in the pulmonary arterial  tree to suggest pulmonary embolus.  No aortic dissection or acute aortic abnormality observed.  Coronary artery atherosclerosis noted. Multiple small gallstones  present in the gallbladder.  Thoracic spondylosis noted. Hypoplastic T1 ribs noted.  Prominent emphysema noted. Breathing motion artifact noted in the  lung bases, minimally reducing diagnostic sensitivity and  specificity. Mild scarring or subsegmental atelectasis noted  anteriorly in the left upper lobe.  IMPRESSION:  1. No filling defect is identified in the pulmonary arterial tree  to suggest pulmonary embolus.  2. Coronary artery atherosclerosis.  3. Cholelithiasis.  4. 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  5. Prominent emphysema.  Assessment/Plan    Principal Problem:  *Acute respiratory failure Active Problems:  HYPERLIPIDEMIA  OBESITY HYPOVENTILATION SYNDROME, bi-pap at night  POLYCYTHEMIA  EMPHYSEMA  RESPIRATORY FAILURE,  CHRONIC  MYOSITIS, Rheum w/u at [facility] Feb 2013  Hypoxemia  Diastolic dysfunction, grade 1 by echo Feb 2013  CAD, turned down for CABG in 1998. RCA and CFX PCI then followed by BMS to LAD in 1999. Myoview low risk 7/11  Plan:  No PE on CT angio.  BP and HR stable.   CKMB 10.7?  Will check Troponin.    Right and left heart caths today.  MD to follow.     LOS: 1 day    Samuel Ball 06/22/2012 11:58 AM

## 2012-06-22 NOTE — Progress Notes (Signed)
Pt had a 3 beat run of VT non sustain. At 1038. Will continue to monitor

## 2012-06-22 NOTE — CV Procedure (Signed)
[facility]     CARDIAC CATHETERIZATION REPORT  Samuel Ball   [id number] [birth year]  Performing Cardiologist: Chrystie Nose Primary Physician: Barbaraann Share, MD Primary Cardiologist:  Dr. Alanda Amass  Procedures Performed:  Left Heart Catheterization via 5 Fr Right radial artery access  Right Heart Catheterization via 5 Fr Right brachial vein access  Native Coronary Angiography  Indication(s): Dyspnea, pulmonary hypertension, ?shunt  History: 66 y.o. male with a history of COPD, pulmonary hypertension and OSA who has been increasingly dyspneic.  An office echocardiogram was performed and did not show a direct intracardiac shunt from right to left. There were a few saline microbubbles that appeared late in the left heart consistent with most likely a small intrapulmonary shunt. These are extremely common in patients with cor pulmonale and pulmonary hypertension. The degree of shunt is very minimal and I doubt significant intrapulmonary shunting exists to explain his dyspnea. There does not appear to be a role for TEE based on the 2D echo. One could consider RHC if it is thought the pulmonary pressures are underestimated by echo.  Consent: The procedure with Risks/Benefits/Alternatives and Indications was reviewed with the patient (and family).  All questions were answered.    Risks / Complications include, but not limited to: Death, MI, CVA/TIA, VF/VT (with defibrillation), Bradycardia (need for temporary pacer placement), contrast induced nephropathy, bleeding / bruising / hematoma / pseudoaneurysm, vascular or coronary injury (with possible emergent CT or Vascular Surgery), adverse medication reactions, infection.    The patient (and family) voice understanding and agree to proceed.    Risks of procedure as well as the alternatives and risks of each were explained to the (patient/caregiver).  Consent for procedure obtained. Consent for signed by MD and  patient with RN witness -- placed on chart.  Procedure: The patient was brought to the 2nd Floor [facility] in the fasting state and prepped and draped in the usual sterile fashion for (Right radial) and right brachial access.  A modified Allen's test with plethysmography was performed on the right wrist demonstrating adequate Ulnar Artery collateral flow.    Sterile technique was used including antiseptics, cap, gloves, gown, hand hygiene, mask and sheet.  Skin prep: Chlorhexidine;  Time Out: Verified patient identification, verified procedure, site/side was marked, verified correct patient position, special equipment/implants available, medications/allergies/relevent history reviewed, required imaging and test results available.  Performed  The right wrist was anesthetized with 1% subcutaneous Lidocaine.  The right radial artery was accessed using the Seldinger Technique with placement of a 6 Fr Glide Sheath. The sheath was aspirated and flushed.  Then a total of 10 ml of standard Radial Artery Cocktail (see medications) was infused.  A 5 Fr TIG 4.0 Catheter was advanced of over a Safety J wire into the ascending Aorta.  The catheter was used to engage the left and right coronary artery.  Multiple cineangiographic views of the left and right coronary artery system(s) were performed. This catheter was then exchanged over the Long Exchange Safety J wire for an angled Pigtail catheter that was advanced across the Aortic Valve.  LV hemodynamics were measured.  LV hemodynamics were then re-sampled, and the catheter was pulled back across the Aortic Valve for measurement of "pull-back" gradient.  The catheter and the wire were removed completely out of the body.  The right brachial vein was accessed using the seldinger technique through an IV which was placed. The IV catheter was exchanged for a 61F brachial  sheath. This was aspirated and flushed. Subsequently right heart  catheterization was performed with the 65F PA catheter which was advanced into the RA, RV, PA and PCWP positions. Pressure measurements were made. A "Saturation Run" was performed, obtaining R-PA, Main PA, mid-RA, SVC and IVC saturations.  The PA catheter was then used to obtain simultaneous LV/RV pressures.  It was completely withdrawn from the body.  The sheaths were removed in the Cath Lab with a TR band placed at 10 ml Air at 15:55 (time).  Reverse Allen's test revealed non-occlusive hemostasis.  The patient was transported to the cath lab holding area in stable condition.   The patient  was stable before, during and following the procedure.   Patient did tolerate procedure well. There were not complications.  EBL: <10 cc  Medications:  Premedication: 5 mg Valium  Sedation:  None additional  Contrast:  50 cc Omnipaque  5000 IU heparin IV  Hemodynamics:  Central Aortic Pressure / Mean Aortic Pressure: 137/82  LV Pressure / LV End diastolic Pressure:  23  Right Heart Pressures/Saturations:  RA - 14  RV - 51/14  PA - 49/27 (42)  PCWP - 20  TPG - 22  LV- 116/18 (23)  AO Sat - 97% (on 6L [place])  RPA Sat - 67%  MPA Sat - 69%  RV Sat - 66%  Mid RA Sat - 67%  SVC Sat - 66%  IVC Sat - 58%  QP:Qs - 1.09  Fick CO/CI - 4.7/2.24  Coronary Angiographic Data:  Left Main:   Distal tapering disease up to 20%  Left Anterior Descending (LAD): There is a patent proximal stent with minimal ISR.  1st diagonal (D1):  Moderate-sized D1 with 50-60% eccentric proximal stenosis.  Circumflex (LCx):  Large vessel with a patent mid-LCx stent, just distal to a small OM1 branch.  1st obtuse marginal:  Small branch, no significant disease  2nd obtuse marginal:  Larger branch, no significant disease  Right Coronary Artery: Dominant vessel. There is a mid-RCA stent with up to 30% diffuse ISR.  posterior descending artery: No significant stenoses.  posterior lateral branch:  Truncates distally,  however, this is a <1.5 mm vessel at this point.  Impression: 1.  No significant obstructive CAD. There is moderate stenosis of a diagonal branch which is not causing him      angina and would not likely be responsible for his dyspnea. 2.  Moderate pulmonary arterial hypertension. Transpulmonary gradient is 22.  3.  Elevated right >> left heart pressures.  4.  No evidence for significant "step up or step down" to suggest intracardiac shunt. 5.  Mildly reduced cardiac output.  Plan: 1. Consider stopping atenolol and trying amlodipine to see if it benefits his pulmonary pressures. 2. Consider a trial of Revatio. 3. ABG was 7.37/57/63/33/+5, consistent with chronic primary respiratory acidosis, likely secondary to      Obesity-hypoventilation syndrome +/- COPD.  4. Consider V/Q study to assess for matched or mis-matched defects, since CTA was unrevealing.  The case and results was discussed with the patient. The case and results was not discussed with the patient's PCP. The case and results was not discussed with the patient's Cardiologist.  Time Spend Directly with Patient:  60 minutes  Chrystie Nose, MD, [facility] Attending Cardiologist [facility]  HILTY,Kenneth C 06/22/2012, 3:59 PM

## 2012-06-23 ENCOUNTER — Other Ambulatory Visit: Payer: Self-pay | Admitting: Pulmonary Disease

## 2012-06-23 DIAGNOSIS — J9610 Chronic respiratory failure, unspecified whether with hypoxia or hypercapnia: Secondary | ICD-10-CM

## 2012-06-23 DIAGNOSIS — E678 Other specified hyperalimentation: Secondary | ICD-10-CM

## 2012-06-23 DIAGNOSIS — J438 Other emphysema: Secondary | ICD-10-CM

## 2012-06-23 MED ORDER — FUROSEMIDE 40 MG PO TABS: 80.0000 mg | ORAL_TABLET | Freq: Every day | ORAL | Status: DC

## 2012-06-23 MED ORDER — INFLUENZA VIRUS VACC SPLIT PF IM SUSP
0.5000 mL | INTRAMUSCULAR | Status: AC
  Administered 2012-06-23: 0.5 mL via INTRAMUSCULAR
  Filled 2012-06-23: qty 0.5

## 2012-06-23 MED ORDER — PNEUMOCOCCAL VAC POLYVALENT 25 MCG/0.5ML IJ INJ
0.5000 mL | INJECTION | INTRAMUSCULAR | Status: AC
  Administered 2012-06-23: 0.5 mL via INTRAMUSCULAR
  Filled 2012-06-23: qty 0.5

## 2012-06-23 NOTE — Discharge Summary (Signed)
Physician Discharge Summary     Patient ID: Samuel Ball MRN: [id number] DOB/AGE: [birth year] 66 y.o.  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 RCA and CFX PCI then followed by BMS to LAD in 1999. Myoview low risk 7/11  Brief HPI  66 y/o WM with PMH of chronic respiratory failure, COPD, OHS (on BiPap 17/14 as of 10/2011), CHF, Cor Pulmonale followed by Dr. Shelle Iron who was seen in Pulmonary office on 10/22 with a two week hx of worsening shortness of breath, nasal drainage and fatigue with exertion. In office 10/22 was noted to be hypoxemic on O2, but he did not want to be admitted at that time-was Rx'd a prednisone taper but continued to worsen overnight with new light headedness and called office 10/23 with symptoms and was sent for direct admit.   Significant Hospital tests/ studies/ interventions and procedures   Hospital Course:  Acute/chronic respiratory failure:  The pt has severe hypoxemia with high oxygen demands that is multifactorial. He has OHS, COPD, diastolic dysfunction. Right and left heart cath shows nonobstructive CAD, and no shunt. 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 His PCWP is abnormal at 20, and his PVR is 2-3 wood units indicating a mixed pre and post capillary picture. Will leave him on beta blocker for his diastolic dysfunction, but cannot push dose due to his copd.  Consider adding/changing to CCB per  primary cardiologist  Increase Lasix 80mg  at discharge, Follow up in office w/ bmet .   He is NOT A CANDIDATE for revatio given his very elevated PCWP.   OBESITY HYPOVENTILATION SYNDROME    The pt is on bilevel and ABG shows well compensated hypercarbia. Will consider changing his bilevel to a IVAPS unit in light of his copd and trend toward air trapping.  Continue BILEVEL and O2 at home .   POLYCYTHEMIA (07/01/2007) Felt secondary to his chronic hypoxemia. Had a w/u many years ago for P.Vera.  EMPHYSEMA (07/01/2007)--FEV1 1.83 (52%) in 1998  Felt to have moderate to severe disease. His history is NOT suggestive of an acute exacerbation. Continue on BD, and will d/c steroids.     Procedures :  Left and right heart caths  Hemodynamics:  Central Aortic Pressure / Mean Aortic Pressure: 137/82  LV Pressure / LV End diastolic Pressure: 23  Right Heart Pressures/Saturations:  RA - 14  RV - 51/14  PA - 49/27 (42)  PCWP - 20  TPG - 22  LV- 116/18 (23)  AO Sat - 97% (on 6L )  RPA Sat - 67%  MPA Sat - 69%  RV Sat - 66%  Mid RA Sat - 67%  SVC Sat - 66%  IVC Sat - 58%  QP:Qs - 1.09  Fick CO/CI - 4.7/2.24  Coronary Angiographic Data:  Left Main: Distal tapering disease up to 20%  Left Anterior Descending (LAD): There is a patent proximal stent with minimal ISR.  1st diagonal (D1): Moderate-sized D1 with 50-60% eccentric proximal stenosis.  Circumflex (LCx): Large vessel with a patent mid-LCx stent, just distal to a small OM1 branch.  1st obtuse marginal: Small branch, no significant disease  2nd obtuse marginal: Larger  branch, no significant disease  Right Coronary Artery: Dominant vessel. There is a mid-RCA stent with up to 30% diffuse ISR.  posterior descending artery: No significant stenoses.  posterior lateral branch: Truncates distally, however, this is a <1.5 mm vessel at this point.  Discharge Exam: BP 136/78  Pulse 98  Temp 98.2 F (36.8 C) (Oral)  Resp 20  Ht 5\' 5"  (1.651 m)  Wt 103.1 kg (227 lb 4.7 oz)  BMI 37.82 kg/m2  SpO2 88%  obese male in nad  Nose without purulence or discharge  No skin breakdown or pressure necrosis from bilevel  mask  Neck  without LN, TMG  Chest with decreased bs, a few basilar crackles, no wheezing  Cor with distant HS, regular  LE with no significant edema, no cyanosis  Alert and oriented, moves all 4.    Labs at discharge Lab Results  Component Value Date   CREATININE 0.78 06/21/2012   BUN 17 06/21/2012   NA 139 06/21/2012   K 4.8 06/21/2012   CL 98 06/21/2012   CO2 31 06/21/2012   Lab Results  Component Value Date   WBC 7.3 06/21/2012   HGB 18.2* 06/21/2012   HCT 55.2* 06/21/2012   MCV 93.1 06/21/2012   PLT 131* 06/21/2012   Lab Results  Component Value Date   ALT 32 06/21/2012   AST 29 06/21/2012   ALKPHOS 74 06/21/2012   BILITOT 1.1 06/21/2012   Lab Results  Component Value Date   INR 0.98 06/22/2012    Current radiology studies Ct Angio Chest Pe W/cm &/or Wo Cm  06/21/2012  *RADIOLOGY REPORT*  Clinical Data: Shortness of breath.  Hypoxia.  CT ANGIOGRAPHY CHEST  Technique:  Multidetector CT imaging of the chest using the standard protocol during bolus administration of intravenous contrast. Multiplanar reconstructed images including MIPs were obtained and reviewed to evaluate the vascular anatomy.  Contrast: OMNIPAQUE IOHEXOL 350 MG/ML SOLN  Comparison: Multiple exams, including 02/02/2010  Findings: No filling defect is identified in the pulmonary arterial tree to suggest pulmonary embolus.  No aortic dissection or acute aortic abnormality observed. Coronary artery atherosclerosis noted.  Multiple small gallstones present in the gallbladder.  Thoracic spondylosis noted.  Hypoplastic T1 ribs noted.  Prominent emphysema noted.  Breathing motion artifact noted in the lung bases, minimally reducing diagnostic sensitivity and specificity.  Mild scarring or subsegmental atelectasis noted anteriorly in the left upper lobe.  IMPRESSION:  1. No filling defect is identified in the pulmonary arterial tree to suggest pulmonary embolus. 2.  Coronary artery atherosclerosis. 3.  Cholelithiasis. 4.   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 5.  Prominent emphysema.   Original Report Authenticated By: Dellia Cloud, M.D.     Disposition:        Discharge Orders    Future Appointments: Provider: Department: Dept Phone: Center:   07/07/2012 4:15 PM Barbaraann Share, MD [facility] [phone] None   09/20/2012 1:30 PM Barbaraann Share, MD [facility] [phone] None       Medication List     As of 06/23/2012 11:43 AM    STOP taking these medications         predniSONE 10 MG tablet   Commonly known as: DELTASONE      TAKE these medications         aspirin 81 MG tablet   Take 81 mg by mouth daily.      atenolol 25 MG tablet   Commonly known as: TENORMIN   Take  25 mg by mouth daily.      budesonide 0.5 MG/2ML nebulizer solution   Commonly known as: PULMICORT   Take 2 mLs (0.5 mg total) by nebulization 2 (two) times daily. Use as directed 2 times a day      enalapril 5 MG tablet   Commonly known as: VASOTEC   Take 1 tablet by mouth daily.      furosemide 40 MG tablet   Commonly known as: LASIX   Take 2 tablets (80 mg total) by mouth daily.      ipratropium-albuterol 0.5-2.5 (3) MG/3ML Soln   Commonly known as: DUONEB   Inhale 3 mLs into the lungs 4 (four) times daily.      multivitamin tablet   Take 1 tablet by mouth daily.         Follow-up Information    Follow up with Governor Rooks, MD. On 07/04/2012. (3:30pm)    Contact information:   [address]  [place] [address] [phone]       Follow up with Barbaraann Share, MD. On 07/07/2012. (4:15, arrive at 3:15 for labs -stat bmet )    Contact information:   [address] [facility] [place] [address] [phone]          Discharged Condition  Improved   Physician Statement:   The Patient was personally examined, the discharge assessment and plan has been personally reviewed and I agree with A-NP Parrett's  assessment and  plan. > 30 minutes of time have been dedicated to discharge assessment, planning and discharge instructions.   SignedRubye Oaks NP  06/23/2012, 11:43 AM

## 2012-06-23 NOTE — Progress Notes (Signed)
D/c home. D/c instructions and medications reviewed with Pt. Pt states understanding. All Pt questions answered

## 2012-06-23 NOTE — Progress Notes (Signed)
[facility]  Subjective: Doing Ok.  Objective: Vital signs in last 24 hours: Temp:  [97.9 F (36.6 C)-98.7 F (37.1 C)] 98.2 F (36.8 C) (10/25 1006) Pulse Rate:  [57-105] 98  (10/25 1006) Resp:  [18-20] 20  (10/25 1006) BP: (89-136)/(54-99) 136/78 mmHg (10/25 1006) SpO2:  [88 %-95 %] 88 % (10/25 1006) Weight:  [103.1 kg (227 lb 4.7 oz)] 103.1 kg (227 lb 4.7 oz) (10/25 0636) Last BM Date: 06/22/12  Intake/Output from previous day: 10/24 0701 - 10/25 0700 In: 3 [I.V.:3] Out: 275 [Urine:275] Intake/Output this shift: Total I/O In: 480 [P.O.:480] Out: -   Medications Current Facility-Administered Medications  Medication Dose Route Frequency Provider Last Rate Last Dose  . acetaminophen (TYLENOL) tablet 650 mg  650 mg Oral Q6H PRN Bernadene Person, NP       Or  . acetaminophen (TYLENOL) suppository 650 mg  650 mg Rectal Q6H PRN Bernadene Person, NP      . acetaminophen (TYLENOL) tablet 650 mg  650 mg Oral Q4H PRN Chrystie Nose, MD      . albuterol (PROVENTIL) (5 MG/ML) 0.5% nebulizer solution 2.5 mg  2.5 mg Nebulization Q6H Barbaraann Share, MD   2.5 mg at 06/23/12 0751  . albuterol (PROVENTIL) (5 MG/ML) 0.5% nebulizer solution 2.5 mg  2.5 mg Nebulization Q4H PRN Barbaraann Share, MD   2.5 mg at 06/23/12 0618  . aspirin EC tablet 81 mg  81 mg Oral Daily Barbaraann Share, MD   81 mg at 06/23/12 1008  . atenolol (TENORMIN) tablet 25 mg  25 mg Oral Daily Bernadene Person, NP   25 mg at 06/23/12 1008  . budesonide (PULMICORT) nebulizer solution 0.5 mg  0.5 mg Nebulization BID Bernadene Person, NP   0.5 mg at 06/23/12 0751  . enalapril (VASOTEC) tablet 5 mg  5 mg Oral Daily Bernadene Person, NP   5 mg at 06/23/12 1008  . furosemide (LASIX) tablet 40 mg  40 mg Oral Daily Bernadene Person, NP   40 mg at 06/23/12 1008  . heparin 2-0.9 UNIT/ML-% infusion           . influenza  inactive virus vaccine (FLUZONE/FLUARIX) injection 0.5 mL   0.5 mL Intramuscular Tomorrow-1000 Barbaraann Share, MD      . ipratropium (ATROVENT) nebulizer solution 0.5 mg  0.5 mg Nebulization Q6H Barbaraann Share, MD   0.5 mg at 06/23/12 0751  . lidocaine (XYLOCAINE) 1 % injection           . methylPREDNISolone sodium succinate (SOLU-MEDROL) 40 mg/mL injection 40 mg  40 mg Intravenous Q12H Bernadene Person, NP   40 mg at 06/23/12 1610  . multivitamin with minerals tablet 1 tablet  1 tablet Oral Daily Barbaraann Share, MD   1 tablet at 06/23/12 1008  . nitroGLYCERIN (NTG ON-CALL) 0.2 mg/mL injection           . ondansetron (ZOFRAN) injection 4 mg  4 mg Intravenous Q6H PRN Chrystie Nose, MD      . pneumococcal 23 valent vaccine (PNU-IMMUNE) injection 0.5 mL  0.5 mL Intramuscular Tomorrow-1000 Barbaraann Share, MD      . polyethylene glycol (MIRALAX / GLYCOLAX) packet 17 g  17 g Oral Daily PRN Bernadene Person, NP      . sodium chloride 0.9 % injection 3 mL  3 mL Intravenous Q12H Bernadene Person, NP   3 mL  at 06/23/12 1008  . sodium chloride 0.9 % injection 3 mL  3 mL Intravenous Q12H Chrystie Nose, MD      . sodium chloride 0.9 % injection 3 mL  3 mL Intravenous PRN Chrystie Nose, MD      . verapamil (ISOPTIN) 2.5 MG/ML injection           . DISCONTD: 0.9 %  sodium chloride infusion   Intravenous Continuous Abelino Derrick, [place] 50 mL/hr at 06/22/12 0941    . DISCONTD: 0.9 %  sodium chloride infusion  250 mL Intravenous Continuous Chrystie Nose, MD      . DISCONTD: 0.9 %  sodium chloride infusion  1 mL/kg/hr Intravenous Continuous Chrystie Nose, MD      . DISCONTD: diazepam (VALIUM) tablet 5 mg  5 mg Oral On Call Abelino Derrick, PA        PE: General appearance: alert, cooperative and no distress Lungs: Severely decreased BS Heart: regular rate and rhythm, S1, S2 normal, no murmur, click, rub or gallop Extremities: No LEE Pulses: 2+ and symmetric Neurologic: Grossly normal  Lab Results:   Basename 06/21/12 1435  WBC 7.3  HGB 18.2*   HCT 55.2*  PLT 131*   BMET  Basename 06/21/12 1435  NA 139  K 4.8  CL 98  CO2 31  GLUCOSE 129*  BUN 17  CREATININE 0.78  CALCIUM 10.0   PT/INR  Basename 06/22/12 1117  LABPROT 12.9  INR 0.98    Left and right heart caths Hemodynamics:  Central Aortic Pressure / Mean Aortic Pressure: 137/82  LV Pressure / LV End diastolic Pressure: 23  Right Heart Pressures/Saturations:  RA - 14  RV - 51/14  PA - 49/27 (42)  PCWP - 20  TPG - 22  LV- 116/18 (23)  AO Sat - 97% (on 6L [place])  RPA Sat - 67%  MPA Sat - 69%  RV Sat - 66%  Mid RA Sat - 67%  SVC Sat - 66%  IVC Sat - 58%  QP:Qs - 1.09  Fick CO/CI - 4.7/2.24  Coronary Angiographic Data:  Left Main: Distal tapering disease up to 20%  Left Anterior Descending (LAD): There is a patent proximal stent with minimal ISR.  1st diagonal (D1): Moderate-sized D1 with 50-60% eccentric proximal stenosis.  Circumflex (LCx): Large vessel with a patent mid-LCx stent, just distal to a small OM1 branch.  1st obtuse marginal: Small branch, no significant disease  2nd obtuse marginal: Larger branch, no significant disease  Right Coronary Artery: Dominant vessel. There is a mid-RCA stent with up to 30% diffuse ISR.  posterior descending artery: No significant stenoses.  posterior lateral branch: Truncates distally, however, this is a <1.5 mm vessel at this point. Impression:  1. No significant obstructive CAD. There is moderate stenosis of a diagonal branch which is not causing him  angina and would not likely be responsible for his dyspnea.  2. Moderate pulmonary arterial hypertension. Transpulmonary gradient is 22.  3. Elevated right >> left heart pressures.  4. No evidence for significant "step up or step down" to suggest intracardiac shunt.  5. Mildly reduced cardiac output.  Plan:  1. Consider stopping atenolol and trying amlodipine to see if it benefits his pulmonary pressures.  2. Consider a trial of Revatio.  3. ABG was  7.37/57/63/33/+5, consistent with chronic primary respiratory acidosis, likely secondary to  Obesity-hypoventilation syndrome +/- COPD.  4. Consider V/Q study to assess for matched or mis-matched  defects, since CTA was unrevealing.  The case and results was discussed with the patient.  The case and results was not discussed with the patient's PCP.  The case and results was not discussed with the patient's Cardiologist.  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 RCA and CFX PCI then followed by BMS to LAD in 1999. Myoview low risk 7/11  Plan:  S/P R/L heart caths. No significant CAD.  Moderate pulmonary arterial hypertension.  10 beat WC NSVT on tele.  May need to maintain some dose of beta blocker.  BP stable.  Negative PE on CT angio.   Being DCd by primary.  Will arrange FU with Dr. Alanda Amass.   LOS: 2 days    HAGER, BRYAN 06/23/2012 10:21 AM  I have seen and examined the patient along with Wilburt Finlay, PA.  I have reviewed the chart, notes and new data.  I agree with PA/NP's note.  Thurmon Fair, MD, [facility] [phone] 06/23/2012, 3:03 PM

## 2012-06-23 NOTE — Progress Notes (Signed)
Subjective: Stable overnight, no increased wob.  LHC with nonobstructive CAD.  RHC with moderate pulmonary htn, negative shunt run, and very elevated PCWP.  Objective: Vital signs in last 24 hours: Blood pressure 128/73, pulse 68, temperature 97.9 F (36.6 C), temperature source Oral, resp. rate 20, height 5\' 5"  (1.651 m), weight 227 lb 4.7 oz (103.1 kg), SpO2 94.00%.  Intake/Output from previous day: 10/24 0701 - 10/25 0700 In: 3 [I.V.:3] Out: 275 [Urine:275]   Physical Exam:   obese male in nad Nose without purulence or discharge No skin breakdown or pressure necrosis from cpap mask Neck without LN, TMG Chest with decreased bs, a few basilar crackles, no wheezing Cor with distant HS, regular LE with no significant edema, no cyanosis Alert and oriented, moves all 4.    Lab Results:  Basename 06/21/12 1435  WBC 7.3  HGB 18.2*  HCT 55.2*  PLT 131*   BMET  Basename 06/21/12 1435  NA 139  K 4.8  CL 98  CO2 31  GLUCOSE 129*  BUN 17  CREATININE 0.78  CALCIUM 10.0    Studies/Results: Ct Angio Chest Pe W/cm &/or Wo Cm  06/21/2012  *RADIOLOGY REPORT*  Clinical Data: Shortness of breath.  Hypoxia.  CT ANGIOGRAPHY CHEST  Technique:  Multidetector CT imaging of the chest using the standard protocol during bolus administration of intravenous contrast. Multiplanar reconstructed images including MIPs were obtained and reviewed to evaluate the vascular anatomy.  Contrast: OMNIPAQUE IOHEXOL 350 MG/ML SOLN  Comparison: Multiple exams, including 02/02/2010  Findings: No filling defect is identified in the pulmonary arterial tree to suggest pulmonary embolus.  No aortic dissection or acute aortic abnormality observed. Coronary artery atherosclerosis noted.  Multiple small gallstones present in the gallbladder.  Thoracic spondylosis noted.  Hypoplastic T1 ribs noted.  Prominent emphysema noted.  Breathing motion artifact noted in the lung bases, minimally reducing diagnostic  sensitivity and specificity.  Mild scarring or subsegmental atelectasis noted anteriorly in the left upper lobe.  IMPRESSION:  1. No filling defect is identified in the pulmonary arterial tree to suggest pulmonary embolus. 2.  Coronary artery atherosclerosis. 3.  Cholelithiasis. 4.  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 5.  Prominent emphysema.   Original Report Authenticated By: Dellia Cloud, M.D.     Assessment/Plan: Patient Active Hospital Problem List:  Acute/chronic respiratory failure: The pt has severe hypoxemia with high oxygen demands that is multifactorial.  He has OHS, COPD, diastolic dysfunction.  Right and left heart cath shows nonobstructive CAD, and no shunt.  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  His PCWP is abnormal at 20, and his PVR is 2-3 wood units indicating a mixed pre and post capillary picture.  Will leave him on beta blocker for his diastolic dysfunction, but cannot push dose due to his copd.  Will discuss with his primary cardiologist about adding verapamil??  He needs to have his diuretics increased obviously.  He is NOT A CANDIDATE for revatio given his very elevated PCWP.    OBESITY HYPOVENTILATION SYNDROME The pt is on bilevel and ABG shows well compensated hypercarbia.  Will consider changing his bilevel to a IVAPS unit in light of his copd and trend toward air trapping.    POLYCYTHEMIA (07/01/2007) Felt secondary to his chronic hypoxemia.  Had a w/u many years ago for P.Vera.     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  His history is NOT  suggestive of an acute exacerbation.  Continue on BD, and will d/c steroids.   The pt will be sent home today.       Barbaraann Share, M.D. 06/23/2012, 9:09 AM

## 2012-06-23 NOTE — Discharge Summary (Signed)
As above.  Please see my note earlier in the day.

## 2012-06-28 ENCOUNTER — Other Ambulatory Visit: Payer: Self-pay | Admitting: Pulmonary Disease

## 2012-06-28 ENCOUNTER — Telehealth: Payer: Self-pay | Admitting: Pulmonary Disease

## 2012-06-28 DIAGNOSIS — J9690 Respiratory failure, unspecified, unspecified whether with hypoxia or hypercapnia: Secondary | ICD-10-CM

## 2012-06-28 NOTE — Telephone Encounter (Signed)
Called and spoke with Chanetta Marshall at [employer]--he states patient was placed on new CPAP on 06/27/12.  Patient placed on "learning mode" and machine was set according to what patient was tolerating in this mode.  However, Chanetta Marshall states that patient informed him that he just is not sleeping well on the settings with the new machine.  Chanetta Marshall stated patient is using his old CPAP until pt has his OV on Monday 07/03/12 with Dr. Shelle Iron and this can sorted.  Aware that Dr. Shelle Iron is not in office. Will send to Dr. Shelle Iron as Lorain Childes

## 2012-06-29 ENCOUNTER — Ambulatory Visit: Payer: Medicare PPO | Admitting: Pulmonary Disease

## 2012-07-03 ENCOUNTER — Other Ambulatory Visit (INDEPENDENT_AMBULATORY_CARE_PROVIDER_SITE_OTHER): Payer: Medicare PPO

## 2012-07-03 ENCOUNTER — Ambulatory Visit (INDEPENDENT_AMBULATORY_CARE_PROVIDER_SITE_OTHER): Payer: Medicare PPO | Admitting: Pulmonary Disease

## 2012-07-03 ENCOUNTER — Encounter: Payer: Self-pay | Admitting: Pulmonary Disease

## 2012-07-03 VITALS — BP 112/78 | HR 84 | Temp 97.4°F | Ht 65.0 in | Wt 221.4 lb

## 2012-07-03 DIAGNOSIS — J9600 Acute respiratory failure, unspecified whether with hypoxia or hypercapnia: Secondary | ICD-10-CM

## 2012-07-03 DIAGNOSIS — I509 Heart failure, unspecified: Secondary | ICD-10-CM

## 2012-07-03 DIAGNOSIS — I5082 Biventricular heart failure: Secondary | ICD-10-CM

## 2012-07-03 DIAGNOSIS — J9610 Chronic respiratory failure, unspecified whether with hypoxia or hypercapnia: Secondary | ICD-10-CM

## 2012-07-03 DIAGNOSIS — J9690 Respiratory failure, unspecified, unspecified whether with hypoxia or hypercapnia: Secondary | ICD-10-CM

## 2012-07-03 DIAGNOSIS — J438 Other emphysema: Secondary | ICD-10-CM

## 2012-07-03 DIAGNOSIS — E662 Morbid (severe) obesity with alveolar hypoventilation: Secondary | ICD-10-CM

## 2012-07-03 LAB — BASIC METABOLIC PANEL
BUN: 20 mg/dL (ref 6–23)
CO2: 35 mEq/L — ABNORMAL HIGH (ref 19–32)
Calcium: 9.6 mg/dL (ref 8.4–10.5)
Chloride: 91 mEq/L — ABNORMAL LOW (ref 96–112)
Creatinine, Ser: 1 mg/dL (ref 0.4–1.5)

## 2012-07-03 NOTE — Assessment & Plan Note (Signed)
The patient has been found to have both pre-and post capillary pulmonary hypertension, with a pulmonary capillary wedge pressure of 20, and also a pulmonary vascular resistance of 2-3 wood units.  He is currently not a candidate for any type of vasodilator therapy, and we need to work on getting rid of excess volume.  He has lost 13 pounds since the last visit here, and therefore will continue his diuretic dosing.  His be met shows a slight increase in his creatinine and BUN, and therefore will decrease his dose after one more week.  His oxygen saturations were actually improved from last visit.

## 2012-07-03 NOTE — Patient Instructions (Addendum)
Stay on current dose of lasix for this week, then next Monday change to 40mg  a day on even days, and 80mg  a day on odd days. Come by next Monday and get bloodwork. Will try and get your new bilevel machine working properly for you. followup with me in 3 weeks.

## 2012-07-03 NOTE — Progress Notes (Signed)
  Subjective:    Patient ID: Samuel Ball, male    DOB: March 18, 1946, 66 y.o.   MRN: [id number]  HPI Patient comes in today for followup of his known chronic respiratory failure secondary to obesity hypoventilation syndrome, COPD, and also biventricular heart failure with diastolic component.  He was recently hospitalized with worsening hypoxemia, and had a cardiac catheterization that showed nonobstructive cardiac disease, and no evidence for shunting.  He was found to have pulmonary hypertension, but he had a wedge pressure of 23 his pulmonary vascular resistance was in the 2-3 wood unit range.  The patient was left on his beta blocker for his diastolic dysfunction, and his Lasix dose was increased.  His bilevel was also changed to an IVAPS unit, but he has had difficulty with tolerance.  Apparently, the dme company is having a hard time adjusting.    Review of Systems  Constitutional: Negative for fever and unexpected weight change.  HENT: Negative for ear pain, nosebleeds, congestion, sore throat, rhinorrhea, sneezing, trouble swallowing, dental problem, postnasal drip and sinus pressure.   Eyes: Negative for redness and itching.  Respiratory: Positive for shortness of breath. Negative for cough, chest tightness and wheezing.   Cardiovascular: Negative for palpitations and leg swelling.  Gastrointestinal: Negative for nausea and vomiting.  Genitourinary: Negative for dysuria.  Musculoskeletal: Negative for joint swelling.  Skin: Negative for rash.  Neurological: Negative for headaches.  Hematological: Does not bruise/bleed easily.  Psychiatric/Behavioral: Negative for dysphoric mood. The patient is not nervous/anxious.        Objective:   Physical Exam Obese male in no acute distress Nose without purulent discharge noted No skin breakdown or pressure necrosis from the CPAP mask Chest with mildly decreased breath sounds, no wheezing Cardiac exam with regular rate and rhythm Lower  extremities without edema, no cyanosis       Assessment & Plan:

## 2012-07-03 NOTE — Assessment & Plan Note (Signed)
The patient feels that his breathing has been worse since his bilevel pressure was increased.  With his underlying COPD, I am wondering if this is being caused by worsening air-trapping.  I therefore changed his bilevel to a IVAPS unit, but we are having difficulty getting this adjusted properly.  Will contact his medical equipment company and try to work through this.

## 2012-07-07 ENCOUNTER — Inpatient Hospital Stay: Payer: Medicare PPO | Admitting: Pulmonary Disease

## 2012-07-10 ENCOUNTER — Other Ambulatory Visit: Payer: Self-pay | Admitting: Pulmonary Disease

## 2012-07-10 ENCOUNTER — Other Ambulatory Visit (INDEPENDENT_AMBULATORY_CARE_PROVIDER_SITE_OTHER): Payer: Medicare PPO

## 2012-07-10 DIAGNOSIS — I5082 Biventricular heart failure: Secondary | ICD-10-CM

## 2012-07-10 DIAGNOSIS — I509 Heart failure, unspecified: Secondary | ICD-10-CM

## 2012-07-10 LAB — BASIC METABOLIC PANEL
BUN: 16 mg/dL (ref 6–23)
Calcium: 10 mg/dL (ref 8.4–10.5)
Creatinine, Ser: 0.9 mg/dL (ref 0.4–1.5)
GFR: 85.13 mL/min (ref >60.00)
Glucose, Bld: 93 mg/dL (ref 70–99)

## 2012-07-25 ENCOUNTER — Ambulatory Visit (INDEPENDENT_AMBULATORY_CARE_PROVIDER_SITE_OTHER): Payer: Medicare PPO | Admitting: Pulmonary Disease

## 2012-07-25 ENCOUNTER — Other Ambulatory Visit (INDEPENDENT_AMBULATORY_CARE_PROVIDER_SITE_OTHER): Payer: Medicare PPO

## 2012-07-25 ENCOUNTER — Encounter: Payer: Self-pay | Admitting: Pulmonary Disease

## 2012-07-25 VITALS — BP 110/78 | HR 87 | Temp 97.7°F | Ht 65.0 in | Wt 215.0 lb

## 2012-07-25 DIAGNOSIS — E662 Morbid (severe) obesity with alveolar hypoventilation: Secondary | ICD-10-CM

## 2012-07-25 DIAGNOSIS — J438 Other emphysema: Secondary | ICD-10-CM

## 2012-07-25 DIAGNOSIS — J9610 Chronic respiratory failure, unspecified whether with hypoxia or hypercapnia: Secondary | ICD-10-CM

## 2012-07-25 DIAGNOSIS — I5082 Biventricular heart failure: Secondary | ICD-10-CM

## 2012-07-25 DIAGNOSIS — I509 Heart failure, unspecified: Secondary | ICD-10-CM

## 2012-07-25 LAB — BASIC METABOLIC PANEL
Calcium: 10 mg/dL (ref 8.4–10.5)
Creatinine, Ser: 0.8 mg/dL (ref 0.4–1.5)
GFR: 99.65 mL/min (ref >60.00)
Sodium: 137 mEq/L (ref 135–145)

## 2012-07-25 NOTE — Assessment & Plan Note (Signed)
The patient has lost 6 pounds since the last visit, probably after his diuretic adjustment.  Will send him for blood work today, and let him know the results.  We can then make further adjustments if necessary.

## 2012-07-25 NOTE — Assessment & Plan Note (Signed)
The patient is doing well from a COPD standpoint, with no increased shortness of breath or wheezing.

## 2012-07-25 NOTE — Assessment & Plan Note (Signed)
The patient is doing extremely well on his new IVAPS device.  He feels his breathing is improved in am's, and is more rested.  I have asked him to continue on his noninvasive positive pressure device, and to continue working on weight loss.

## 2012-07-25 NOTE — Patient Instructions (Addendum)
Continue on your current fluid medicine dose.  Will check bloodwork, and call you with results. Continue with your IVAPS machine. Work on [employer] loss Consider a urology evaluation.  followup with me in 2 mos.

## 2012-07-25 NOTE — Progress Notes (Signed)
  Subjective:    Patient ID: Samuel Ball, male    DOB: July 28, 1946, 66 y.o.   MRN: [id number]  HPI The patient comes in today for followup of his COPD, obesity hypoventilation, biventricular heart failure, and chronic respiratory failure.  He has been started on a IVAPS device, and feels this has really helped his breathing and sleep during the night.  He has been diuresing, and his weight is down 6 pounds from the last visit.  He will need to have a BMET checked.  Overall he feels that he is better from his hospitalization.   Review of Systems  Constitutional: Negative for fever and unexpected weight change.  HENT: Negative for ear pain, nosebleeds, congestion, sore throat, rhinorrhea, sneezing, trouble swallowing, dental problem, postnasal drip and sinus pressure.   Eyes: Negative for redness and itching.  Respiratory: Negative for cough, chest tightness, shortness of breath and wheezing.   Cardiovascular: Negative for palpitations and leg swelling.  Gastrointestinal: Negative for nausea and vomiting.  Genitourinary: Negative for dysuria.  Musculoskeletal: Negative for joint swelling.  Skin: Negative for rash.  Neurological: Negative for headaches.  Hematological: Does not bruise/bleed easily.  Psychiatric/Behavioral: Negative for dysphoric mood. The patient is not nervous/anxious.        Objective:   Physical Exam Obese male in no acute distress Nose without purulence or discharge noted No skin breakdown or pressure necrosis from the CPAP mask Chest with a few basal crackles, otherwise clear Cardiac exam with a regular rate and rhythm Lower extremities without edema, no cyanosis Alert and oriented, moves all 4 extremities.       Assessment & Plan:

## 2012-07-31 ENCOUNTER — Other Ambulatory Visit: Payer: Self-pay | Admitting: Pulmonary Disease

## 2012-07-31 DIAGNOSIS — R39198 Other difficulties with micturition: Secondary | ICD-10-CM

## 2012-08-03 ENCOUNTER — Telehealth: Payer: Self-pay | Admitting: Pulmonary Disease

## 2012-08-03 NOTE — Telephone Encounter (Signed)
ATC carol but received after hours answering service. wcb in the AM

## 2012-08-04 NOTE — Telephone Encounter (Signed)
Samuel Ball with [name] aware letter has been faxed to her. Letter placed in [facility] scan folder.

## 2012-08-04 NOTE — Telephone Encounter (Signed)
Per Okey Regal with [name], the pt's insurance had approved for pt to get ivap and pt started on the new system on 06/27/12. [employer] is now asking for a letter of medical necessity from Dr. Shelle Iron stating why the pt was switched from bipap to ivap. She says this can be faxed to her at [phone]. KC, pls advise.

## 2012-09-20 ENCOUNTER — Ambulatory Visit: Payer: Medicare PPO | Admitting: Pulmonary Disease

## 2012-09-27 ENCOUNTER — Telehealth: Payer: Self-pay | Admitting: *Deleted

## 2012-09-27 NOTE — Telephone Encounter (Signed)
Just send my last ov.

## 2012-09-27 NOTE — Telephone Encounter (Signed)
Dr Shelle Iron please advise if any office note needs to be sent to [facility]---form is in your Hence Derrick folder to review.  On 07/25/12 OV mentions pt needs to consider eval with Urologist. Wanted to verify with you before I faxed a response to [facility].  Dr Shelle Iron please advise. Thanks.

## 2012-09-28 ENCOUNTER — Encounter: Payer: Self-pay | Admitting: Pulmonary Disease

## 2012-09-28 ENCOUNTER — Ambulatory Visit (INDEPENDENT_AMBULATORY_CARE_PROVIDER_SITE_OTHER): Payer: Medicare Other | Admitting: Pulmonary Disease

## 2012-09-28 VITALS — BP 104/76 | HR 91 | Temp 97.5°F | Ht 65.0 in | Wt 206.2 lb

## 2012-09-28 DIAGNOSIS — I509 Heart failure, unspecified: Secondary | ICD-10-CM

## 2012-09-28 DIAGNOSIS — J438 Other emphysema: Secondary | ICD-10-CM

## 2012-09-28 DIAGNOSIS — E662 Morbid (severe) obesity with alveolar hypoventilation: Secondary | ICD-10-CM

## 2012-09-28 DIAGNOSIS — J9610 Chronic respiratory failure, unspecified whether with hypoxia or hypercapnia: Secondary | ICD-10-CM

## 2012-09-28 DIAGNOSIS — I5082 Biventricular heart failure: Secondary | ICD-10-CM

## 2012-09-28 NOTE — Progress Notes (Signed)
  Subjective:    Patient ID: Samuel Ball, male    DOB: 11-Nov-1945, 67 y.o.   MRN: [id number]  HPI The patient comes in today for followup of his known chronic respiratory failure secondary to obesity hypoventilation syndrome and COPD.  He has really done well since the last visit, and has lost not additional pounds of weight.  He is staying on his positive pressure device, as well as his oxygen and pulmonary medications.  He has not had any cough or congestion.   Review of Systems  Constitutional: Negative for fever and unexpected weight change.  HENT: Negative for ear pain, nosebleeds, congestion, sore throat, rhinorrhea, sneezing, trouble swallowing, dental problem, postnasal drip and sinus pressure.   Eyes: Negative for redness and itching.  Respiratory: Negative for cough, chest tightness, shortness of breath and wheezing.   Cardiovascular: Negative for palpitations and leg swelling.  Gastrointestinal: Negative for nausea and vomiting.  Genitourinary: Negative for dysuria.  Musculoskeletal: Negative for joint swelling.  Skin: Negative for rash.  Neurological: Negative for headaches.  Hematological: Does not bruise/bleed easily.  Psychiatric/Behavioral: Negative for dysphoric mood. The patient is not nervous/anxious.        Objective:   Physical Exam Overweight male in no acute distress Nose without purulence or discharge noted No skin breakdown or pressure necrosis from CPAP mask Neck without lymphadenopathy or thyromegaly Chest with decreased breath sounds, no wheezes or rhonchi Cardiac exam with regular rate and rhythm Lower extremities with no edema, no cyanosis Alert and oriented, moves all 4 extremities.       Assessment & Plan:

## 2012-09-28 NOTE — Patient Instructions (Addendum)
Stay on current treatment Keep working on weight loss followup with me in 4mos, but call if having more issues.

## 2012-09-28 NOTE — Assessment & Plan Note (Signed)
The patient feels that he is doing much better on his IV APS unit.  He feels he is sleeping well, and his work of breathing has decreased.

## 2012-09-28 NOTE — Assessment & Plan Note (Signed)
The patient is doing well on his current bronchodilator regimen, and has not had a recent chest infection or acute exacerbation.

## 2012-09-28 NOTE — Telephone Encounter (Signed)
Complete. This has been faxed to [facility] attn: Ysidro Evert at [phone].

## 2013-01-26 ENCOUNTER — Encounter: Payer: Self-pay | Admitting: Allergy

## 2013-01-26 ENCOUNTER — Ambulatory Visit (INDEPENDENT_AMBULATORY_CARE_PROVIDER_SITE_OTHER): Payer: Medicare Other | Admitting: Pulmonary Disease

## 2013-01-26 ENCOUNTER — Encounter: Payer: Self-pay | Admitting: Pulmonary Disease

## 2013-01-26 ENCOUNTER — Other Ambulatory Visit: Payer: Self-pay | Admitting: Pulmonary Disease

## 2013-01-26 VITALS — BP 110/72 | HR 80 | Temp 97.7°F | Ht 65.0 in | Wt 207.0 lb

## 2013-01-26 DIAGNOSIS — E662 Morbid (severe) obesity with alveolar hypoventilation: Secondary | ICD-10-CM

## 2013-01-26 DIAGNOSIS — I5082 Biventricular heart failure: Secondary | ICD-10-CM

## 2013-01-26 DIAGNOSIS — J9610 Chronic respiratory failure, unspecified whether with hypoxia or hypercapnia: Secondary | ICD-10-CM

## 2013-01-26 DIAGNOSIS — I509 Heart failure, unspecified: Secondary | ICD-10-CM

## 2013-01-26 DIAGNOSIS — J438 Other emphysema: Secondary | ICD-10-CM

## 2013-01-26 NOTE — Patient Instructions (Addendum)
Try AYR nasal saline JELLY for your nose to help with dryness. No change in breathing medications Keep working on weight loss followup with me in 4mos, but call if you are having worsening breathing issues.

## 2013-01-26 NOTE — Assessment & Plan Note (Signed)
The patient feels that he is comfortable on his current IVAPS device, with no symptoms suggestive of air trapping.  He is having no issues with his mask fit, and feels the device is helping his breathing and also his sleep.

## 2013-01-26 NOTE — Progress Notes (Signed)
  Subjective:    Patient ID: Samuel Ball, male    DOB: 31-Aug-1945, 67 y.o.   MRN: [id number]  HPI The patient comes in today for followup of his known chronic respiratory failure, secondary to COPD, biventricular failure, and obesity hypoventilation syndrome.  He is wearing oxygen compliantly, and staying on his bronchodilator regimen.  His weight is stable since his last visit, but he feels that his breathing continues to be an issue for him.  It is not necessarily any worse, but it is not any better.  Denies any chest congestion, cough, or mucus production.   Review of Systems  Constitutional: Negative for fever and unexpected weight change.  HENT: Negative for ear pain, nosebleeds, congestion, sore throat, rhinorrhea, sneezing, trouble swallowing, dental problem, postnasal drip and sinus pressure.   Eyes: Negative for redness and itching.  Respiratory: Positive for shortness of breath. Negative for cough, chest tightness and wheezing.   Cardiovascular: Negative for palpitations and leg swelling.  Gastrointestinal: Negative for nausea and vomiting.  Genitourinary: Negative for dysuria.  Musculoskeletal: Negative for joint swelling.  Skin: Negative for rash.  Neurological: Negative for headaches.  Hematological: Bruises/bleeds easily.  Psychiatric/Behavioral: Negative for dysphoric mood. The patient is not nervous/anxious.        Objective:   Physical Exam Overweight male in no acute distress Nose without purulent discharge noted.  Excoriated areas along the nasal septum bilaterally with crusted blood. Oropharynx clear Neck without lymphadenopathy or thyromegaly Chest with decreased breath sounds, no wheezing  Cor with rrr, no murmur LE without edema, no cyanosis Alert and oriented, moves all 4.        Assessment & Plan:

## 2013-01-26 NOTE — Assessment & Plan Note (Signed)
The patient is stable from a COPD standpoint on his current bronchodilator regimen.  He has no wheezing on exam, and has not had an acute exacerbation since last visit.

## 2013-01-26 NOTE — Assessment & Plan Note (Signed)
The patient continues to have a very high oxygen requirement, and I continue to believe there is some sort of shunt physiology going on that we have not been able to identify.

## 2013-03-14 ENCOUNTER — Other Ambulatory Visit: Payer: Self-pay | Admitting: Pulmonary Disease

## 2013-03-14 MED ORDER — BUDESONIDE 0.5 MG/2ML IN SUSP: 0.5000 mg | Freq: Two times a day (BID) | RESPIRATORY_TRACT | Status: DC

## 2013-03-14 MED ORDER — IPRATROPIUM-ALBUTEROL 0.5-2.5 (3) MG/3ML IN SOLN: 3.0000 mL | Freq: Four times a day (QID) | RESPIRATORY_TRACT | Status: DC

## 2013-04-26 ENCOUNTER — Telehealth: Payer: Self-pay | Admitting: Pulmonary Disease

## 2013-04-26 DIAGNOSIS — J439 Emphysema, unspecified: Secondary | ICD-10-CM

## 2013-04-26 DIAGNOSIS — J9610 Chronic respiratory failure, unspecified whether with hypoxia or hypercapnia: Secondary | ICD-10-CM

## 2013-04-26 NOTE — Telephone Encounter (Signed)
Called and spoke with pt and he stated that he needs an order sent in to [facility] for a new mask (mirage mask) and supplies.  Pt stated that he uses [facility] for his supplies.  Pt has a pending appt on  September 30.  Order has been placed to [facility].  Nothing further is needed.

## 2013-05-29 ENCOUNTER — Ambulatory Visit: Payer: Medicare Other | Admitting: Pulmonary Disease

## 2013-06-08 ENCOUNTER — Other Ambulatory Visit: Payer: Self-pay | Admitting: *Deleted

## 2013-06-11 ENCOUNTER — Other Ambulatory Visit: Payer: Self-pay | Admitting: Pulmonary Disease

## 2013-06-11 ENCOUNTER — Other Ambulatory Visit: Payer: Self-pay | Admitting: *Deleted

## 2013-06-11 DIAGNOSIS — I5082 Biventricular heart failure: Secondary | ICD-10-CM

## 2013-06-11 MED ORDER — ATENOLOL 25 MG PO TABS: 25.0000 mg | ORAL_TABLET | Freq: Every day | ORAL | Status: DC

## 2013-06-11 MED ORDER — ENALAPRIL MALEATE 5 MG PO TABS: 5.0000 mg | ORAL_TABLET | Freq: Every day | ORAL | Status: DC

## 2013-06-11 NOTE — Telephone Encounter (Signed)
Rx was sent to pharmacy electronically. 

## 2013-06-26 ENCOUNTER — Ambulatory Visit: Payer: Medicare Other | Admitting: Cardiology

## 2013-07-04 ENCOUNTER — Ambulatory Visit (INDEPENDENT_AMBULATORY_CARE_PROVIDER_SITE_OTHER): Payer: Medicare Other | Admitting: Cardiology

## 2013-07-04 ENCOUNTER — Other Ambulatory Visit (INDEPENDENT_AMBULATORY_CARE_PROVIDER_SITE_OTHER): Payer: Medicare Other

## 2013-07-04 ENCOUNTER — Ambulatory Visit (INDEPENDENT_AMBULATORY_CARE_PROVIDER_SITE_OTHER): Payer: Medicare Other | Admitting: Pulmonary Disease

## 2013-07-04 ENCOUNTER — Encounter: Payer: Self-pay | Admitting: Cardiology

## 2013-07-04 ENCOUNTER — Encounter: Payer: Self-pay | Admitting: Pulmonary Disease

## 2013-07-04 VITALS — BP 132/82 | HR 86 | Temp 97.6°F | Ht 65.0 in | Wt 210.0 lb

## 2013-07-04 VITALS — BP 124/72 | HR 80 | Ht 65.0 in | Wt 209.0 lb

## 2013-07-04 DIAGNOSIS — J9610 Chronic respiratory failure, unspecified whether with hypoxia or hypercapnia: Secondary | ICD-10-CM

## 2013-07-04 DIAGNOSIS — I509 Heart failure, unspecified: Secondary | ICD-10-CM

## 2013-07-04 DIAGNOSIS — J438 Other emphysema: Secondary | ICD-10-CM

## 2013-07-04 DIAGNOSIS — I5082 Biventricular heart failure: Secondary | ICD-10-CM

## 2013-07-04 DIAGNOSIS — I2510 Atherosclerotic heart disease of native coronary artery without angina pectoris: Secondary | ICD-10-CM

## 2013-07-04 DIAGNOSIS — E662 Morbid (severe) obesity with alveolar hypoventilation: Secondary | ICD-10-CM

## 2013-07-04 DIAGNOSIS — J439 Emphysema, unspecified: Secondary | ICD-10-CM

## 2013-07-04 DIAGNOSIS — Z23 Encounter for immunization: Secondary | ICD-10-CM

## 2013-07-04 LAB — BASIC METABOLIC PANEL
Chloride: 93 mEq/L — ABNORMAL LOW (ref 96–112)
GFR: 89.25 mL/min (ref >60.00)
Glucose, Bld: 96 mg/dL (ref 70–99)
Potassium: 4.6 mEq/L (ref 3.5–5.1)
Sodium: 139 mEq/L (ref 135–145)

## 2013-07-04 MED ORDER — ATENOLOL 25 MG PO TABS: 25.0000 mg | ORAL_TABLET | Freq: Every day | ORAL | Status: DC

## 2013-07-04 MED ORDER — ENALAPRIL MALEATE 5 MG PO TABS: 5.0000 mg | ORAL_TABLET | Freq: Every day | ORAL | Status: DC

## 2013-07-04 NOTE — Progress Notes (Signed)
HPI: 67 year old male for evaluation of congestive heart failure, pulmonary HTN and CAD. Previously followed by Dr Alanda Amass. Patient has had previous PCI after being turned down for coronary artery bypass graft in 1998. VQ scan in June of 2011 showed obstructive pulmonary disease but low probability for pulmonary embolus. Nuclear study in July of 2011 showed a fixed inferior defect consistent with artifact or prior infarct. No ischemia. Echocardiogram in February of 2013 showed normal LV function, moderate left atrial enlargement, mild right atrial enlargement, late saline microcavitation bubbles suggestive of intrapulmonary or intrahepatic shunt and mild pulmonary hypertension. There was grade 1 diastolic dysfunction. Cardiac catheterization in October 2013 showed a pulmonary capillary wedge pressure of 20 and a PA pressure of 49/27. There was a 20% left main. There was a patent stent in the proximal LAD. There was a 50-60% first diagonal. The stent in the circumflex was patent. There was a 30% in-stent restenosis in the right coronary artery. There was no shunt. Pulmonary pressures elevated greater than left heart pressures. Chest CT in October 2013 showed no pulmonary embolus. There was emphysema noted. Patient has been felt to have chronic respiratory insufficiency related to obesity hypoventilation syndrome, OSA, COPD and diastolic dysfunction. He has severe dyspnea on exertion but no orthopnea, PND, pedal edema, syncope or chest pain.  Current Outpatient Prescriptions  Medication Sig Dispense Refill  . Ascorbic Acid (VITAMIN C) 100 MG tablet Take 100 mg by mouth daily.      Marland Kitchen aspirin 81 MG tablet Take 81 mg by mouth daily.        Marland Kitchen atenolol (TENORMIN) 25 MG tablet Take 1 tablet (25 mg total) by mouth daily.  90 tablet  0  . budesonide (PULMICORT) 0.5 MG/2ML nebulizer solution Take 2 mLs (0.5 mg total) by nebulization 2 (two) times daily. Use as directed 2 times a day  120 mL  6  . enalapril  (VASOTEC) 5 MG tablet Take 1 tablet (5 mg total) by mouth daily.  90 tablet  0  . furosemide (LASIX) 40 MG tablet Take 2 tablets (80 mg total) by mouth daily.      Marland Kitchen ipratropium-albuterol (DUONEB) 0.5-2.5 (3) MG/3ML SOLN Inhale 3 mLs into the lungs 4 (four) times daily.  360 mL  6  . Multiple Vitamin (MULTIVITAMIN) tablet Take 1 tablet by mouth daily.        Marland Kitchen RAPAFLO 8 MG CAPS capsule 8 mg daily with breakfast.        No current facility-administered medications for this visit.    Allergies  Allergen Reactions  . Lipitor [Atorvastatin Calcium]     Unknown  . Simvastatin     REACTION: acute myositis    Past Medical History  Diagnosis Date  . Polycythemia   . Obesity hypoventilation syndrome   . Cor pulmonale   . Emphysema of lung   . Respiratory failure, chronic   . CHF (congestive heart failure)   . Myocardial infarction   . Sleep apnea     uses cpap  . CAD (coronary artery disease)     Past Surgical History  Procedure Laterality Date  . Cardiac stents    . Tonsillectomy    . Vasectomy      History   Social History  . Marital Status: Single    Spouse Name: N/A    Number of Children: 3  . Years of Education: N/A   Occupational History  . retired     Psychologist, occupational   Social  History Main Topics  . Smoking status: Former Smoker -- 3.00 packs/day for 42 years    Types: Cigarettes    Quit date: 08/31/1999  . Smokeless tobacco: Never Used  . Alcohol Use: No  . Drug Use: No  . Sexual Activity: Not on file   Other Topics Concern  . Not on file   Social History Narrative  . No narrative on file    Family History  Problem Relation Age of Onset  . Lung cancer Father     ROS: no fevers or chills, productive cough, hemoptysis, dysphasia, odynophagia, melena, hematochezia, dysuria, hematuria, rash, seizure activity, orthopnea, PND, pedal edema, claudication. Remaining systems are negative.  Physical Exam:   Blood pressure 124/72, pulse 80, height 5\' 5"  (1.651 m),  weight 209 lb (94.802 kg), SpO2 82.00%.  General:  Well developed/well nourished in NAD Skin warm/dry Patient not depressed No peripheral clubbing Back-normal HEENT-normal/normal eyelids Neck supple/normal carotid upstroke bilaterally; no bruits; no JVD; no thyromegaly chest - diminished breath sounds throughout CV - extremely distant heart sounds, RRR/normal S1 and S2; no murmurs, rubs or gallops;  PMI nondisplaced Abdomen -NT/ND, no HSM, no mass, + bowel sounds, no bruit 2+ femoral pulses, no bruits Ext-no edema, chords, 2+ DP Neuro-grossly nonfocal  ECG sinus rhythm at a rate of 70. Right axis deviation. Prior anterior infarct.

## 2013-07-04 NOTE — Assessment & Plan Note (Addendum)
Continue aspirin. Intolerant to statins. I will obtain records from previous office visits from Dr. Alanda Amass.

## 2013-07-04 NOTE — Assessment & Plan Note (Signed)
Stable on his current bronchodilator regimen, with no evidence for bronchospasm or acute exacerbation.  I will try him on Brovana, and I have provided samples with instructions.

## 2013-07-04 NOTE — Progress Notes (Signed)
  Subjective:    Patient ID: LEV CERVONE, male    DOB: [birth year], 67 y.o.   MRN: [id number]  HPI The pt comes in today for f/u of his known multifactorial chronic respiratory failure.  He has copd, OHS, biventricular failure, CAD.  He feels his breathing has continued to slowly decline since last visit.  No acute process, and denies any congestion or purulence.  His weight is near baseline, and does not have any edema today.  He is wearing IVAPS compliantly, and even uses during the day if his WOB is elevated.    Review of Systems  Constitutional: Negative for fever and unexpected weight change.  HENT: Negative for congestion, dental problem, ear pain, nosebleeds, postnasal drip, rhinorrhea, sinus pressure, sneezing, sore throat and trouble swallowing.   Eyes: Negative for redness and itching.  Respiratory: Positive for shortness of breath. Negative for cough, chest tightness and wheezing.   Cardiovascular: Negative for palpitations and leg swelling.  Gastrointestinal: Negative for nausea and vomiting.  Genitourinary: Negative for dysuria.  Musculoskeletal: Negative for joint swelling.  Skin: Negative for rash.  Neurological: Negative for headaches.  Hematological: Does not bruise/bleed easily.  Psychiatric/Behavioral: Negative for dysphoric mood. The patient is not nervous/anxious.        Objective:   Physical Exam Obese male in nad Nose without purulence or d/c noted. No skin breakdown or pressure necrosis from cpap mask Neck without LN or TMG Chest with decreased bs, basilar crackles, no wheezing.  Cor with rrr LE with minimal edema, no cyanosis Alert and oriented, moves all 4.        Assessment & Plan:

## 2013-07-04 NOTE — Assessment & Plan Note (Signed)
Felt to be a combination of diastolic dysfunction,emphysema, obesity hypoventilation syndrome and obstructive sleep apnea. He appears to be euvolemic on examination. Continue present dose of Lasix. Check potassium and renal function.

## 2013-07-04 NOTE — Patient Instructions (Addendum)
Your physician wants you to follow-up in: 6 MONTHS WITH DR Jens Som You will receive a reminder letter in the mail two months in advance. If you don't receive a letter, please call our office to schedule the follow-up appointment.   Your physician recommends that you return for lab work WITH DR CLANCE=BMP

## 2013-07-04 NOTE — Patient Instructions (Signed)
No change in medications except try brovana samples am and pm in the place of your extra albuterol treatments.  Let me know if you think this works better. Stay on oxygen and IVAPS machine  Watch fluid balance carefully. Will give you a flu shot today.  Will get a download off your machine to check on things followup with me again in 3 mos.

## 2013-07-04 NOTE — Assessment & Plan Note (Signed)
Continue diet. Intolerant to statins. 

## 2013-07-04 NOTE — Assessment & Plan Note (Signed)
The pt is wearing his IVAPS device consistently, and will also use during the day for increased wob.  Will get download for review of data.

## 2013-07-09 ENCOUNTER — Telehealth: Payer: Self-pay | Admitting: *Deleted

## 2013-07-09 NOTE — Telephone Encounter (Signed)
Pt aware of results and recs per KC.  Card in mail back to pt.

## 2013-07-09 NOTE — Telephone Encounter (Signed)
Let pt know that his downloaded data looks good.  The only thing I see is that he is having a little more mask leak than I would like to see.  Work on [employer] fit with home care company if he is having issues. If the increased shortness of breath after coming off device continues, can consider adjusting the timing of the breaths.  Just let us know.

## 2013-07-09 NOTE — Telephone Encounter (Signed)
Pt mailed CPAP chip to our office for Dr Shelle Iron to review D/L. Please advise Dr Shelle Iron.  D/L given to [facility]. Chip to be mailed back to patient.

## 2013-08-06 ENCOUNTER — Other Ambulatory Visit: Payer: Self-pay | Admitting: Pulmonary Disease

## 2013-08-06 MED ORDER — FUROSEMIDE 40 MG PO TABS: 40.0000 mg | ORAL_TABLET | Freq: Every day | ORAL | Status: DC

## 2013-10-03 ENCOUNTER — Encounter: Payer: Self-pay | Admitting: Pulmonary Disease

## 2013-10-03 ENCOUNTER — Other Ambulatory Visit (INDEPENDENT_AMBULATORY_CARE_PROVIDER_SITE_OTHER): Payer: Medicare Other

## 2013-10-03 ENCOUNTER — Ambulatory Visit (INDEPENDENT_AMBULATORY_CARE_PROVIDER_SITE_OTHER): Payer: Medicare Other | Admitting: Pulmonary Disease

## 2013-10-03 VITALS — BP 126/78 | HR 89 | Ht 66.0 in | Wt 213.4 lb

## 2013-10-03 DIAGNOSIS — J438 Other emphysema: Secondary | ICD-10-CM

## 2013-10-03 DIAGNOSIS — E662 Morbid (severe) obesity with alveolar hypoventilation: Secondary | ICD-10-CM

## 2013-10-03 DIAGNOSIS — D751 Secondary polycythemia: Secondary | ICD-10-CM

## 2013-10-03 DIAGNOSIS — J9610 Chronic respiratory failure, unspecified whether with hypoxia or hypercapnia: Secondary | ICD-10-CM

## 2013-10-03 DIAGNOSIS — J439 Emphysema, unspecified: Secondary | ICD-10-CM

## 2013-10-03 LAB — BASIC METABOLIC PANEL
BUN: 20 mg/dL (ref 6–23)
CALCIUM: 9.9 mg/dL (ref 8.4–10.5)
CO2: 36 mEq/L — ABNORMAL HIGH (ref 19–32)
Chloride: 96 mEq/L (ref 96–112)
Creatinine, Ser: 0.9 mg/dL (ref 0.4–1.5)
GFR: 90.34 mL/min (ref >60.00)
GLUCOSE: 77 mg/dL (ref 70–99)
POTASSIUM: 4.6 meq/L (ref 3.5–5.1)
SODIUM: 140 meq/L (ref 135–145)

## 2013-10-03 LAB — CBC
Hemoglobin: 18.3 g/dL (ref 13.0–17.0)
MCHC: 33.1 g/dL (ref 30.0–36.0)
MCV: 91.7 fl (ref 78.0–100.0)
Platelets: 105 10*3/uL — ABNORMAL LOW (ref 150.0–400.0)
RBC: 6.02 Mil/uL — AB (ref 4.22–5.81)
RDW: 14.4 % (ref 11.5–14.6)
WBC: 8.6 10*3/uL (ref 4.5–10.5)

## 2013-10-03 NOTE — Assessment & Plan Note (Signed)
Needs to have cbc rechecked.

## 2013-10-03 NOTE — Assessment & Plan Note (Signed)
Continues on IVAPS with success.  Tolerating device well.  Will often use during day when he has increased sob.

## 2013-10-03 NOTE — Patient Instructions (Signed)
No change in medications Stay on continuous oxygen as much as possible. Will check bloodwork today, and let you know the results.  We can then decide about the chest scan. Keep working on weight loss. followup with me again in 24mos, but call if you feel you are worsening.

## 2013-10-03 NOTE — Progress Notes (Signed)
   Subjective:    Patient ID: Samuel Ball, male    DOB: 09/17/1945, 68 y.o.   MRN: [id number]  HPI The patient comes in today for followup of his chronic respiratory failure secondary to COPD, OHS, and underlying diastolic dysfunction. He feels that his breathing is doing very poorly at this time, but denies any wheezing or chest congestion. He is continuing with his noninvasive positive pressure ventilation at night, and continues to have high oxygen needs during the day. His weight is up 3 pounds from the last visit, but he does not have worsening edema. He is having severe dyspnea with any exertion. He denies any chest pain or hemoptysis.   Review of Systems  Constitutional: Positive for activity change and fatigue. Negative for fever and unexpected weight change.  HENT: Negative for congestion, dental problem, ear pain, nosebleeds, postnasal drip, rhinorrhea, sinus pressure, sneezing, sore throat and trouble swallowing.   Eyes: Negative for redness and itching.  Respiratory: Positive for shortness of breath. Negative for cough, chest tightness and wheezing.   Cardiovascular: Negative for palpitations and leg swelling.  Gastrointestinal: Negative for nausea and vomiting.  Genitourinary: Negative for dysuria.  Musculoskeletal: Negative for joint swelling.  Skin: Negative for rash.  Neurological: Negative for headaches.  Hematological: Does not bruise/bleed easily.  Psychiatric/Behavioral: Negative for dysphoric mood. The patient is not nervous/anxious.        Objective:   Physical Exam Obese male in no acute distress Nose without purulence or discharge noted Neck without lymphadenopathy or thyromegaly Chest with decreased breath sounds, a few basilar crackles, no wheezing Cardiac exam with distant heart sounds but regular Lower extremities with minimal edema, no cyanosis Alert and oriented, moves all 4 extremities.       Assessment & Plan:

## 2013-10-03 NOTE — Assessment & Plan Note (Addendum)
The patient continues to have severe dyspnea on exertion at times, along with falling oxygen saturations intermittently. This has been an ongoing process for years, and we have never really improved him with prednisone tapers or admission to the hospital.  He does not seem to be volume overloaded by weight or lower extremity edema, but will recheck his blood work to make sure that his renal function is adequate and he does not have worsening polycythemia. I have considered whether it would be worthwhile to do a CT chest to evaluate for pulmonary emboli, but I suspect this is a low yield.  It had a long discussion with the patient about considering hospice, but he does not feel that he is ready for this. I've also discussed with him trying by mouth morphine as needed for his dyspnea, and he would like to hold off on this as well.

## 2013-10-05 ENCOUNTER — Other Ambulatory Visit: Payer: Self-pay | Admitting: Pulmonary Disease

## 2013-10-09 ENCOUNTER — Other Ambulatory Visit (HOSPITAL_COMMUNITY): Payer: Self-pay | Admitting: *Deleted

## 2013-10-10 ENCOUNTER — Ambulatory Visit (HOSPITAL_COMMUNITY)
Admission: RE | Admit: 2013-10-10 | Discharge: 2013-10-10 | Disposition: A | Discharge status: Home / Self Care | Payer: Medicare Other | Source: Ambulatory Visit | Attending: Pulmonary Disease | Admitting: Pulmonary Disease

## 2013-10-10 DIAGNOSIS — D751 Secondary polycythemia: Secondary | ICD-10-CM | POA: Insufficient documentation

## 2013-10-10 NOTE — Progress Notes (Signed)
Removed 500cc of blood from pt's right AC per protocal and MD order.  Pt tolerated procedure well

## 2013-11-16 ENCOUNTER — Other Ambulatory Visit: Payer: Self-pay | Admitting: Pulmonary Disease

## 2013-11-16 MED ORDER — FUROSEMIDE 40 MG PO TABS
40.0000 mg | ORAL_TABLET | Freq: Every day | ORAL | Status: DC
Start: 2013-11-16 — End: 2014-10-25

## 2013-11-20 ENCOUNTER — Other Ambulatory Visit: Payer: Self-pay | Admitting: Pulmonary Disease

## 2013-11-22 ENCOUNTER — Telehealth: Payer: Self-pay | Admitting: Pulmonary Disease

## 2013-11-22 NOTE — Telephone Encounter (Signed)
Called and spoke with [place] from [place]. Calling to clarify dosage of budesonide. Advised her per pt med list budesonide 0.5 bid. Nothing further needed

## 2013-12-31 ENCOUNTER — Ambulatory Visit: Payer: Medicare Other | Admitting: Pulmonary Disease

## 2013-12-31 ENCOUNTER — Ambulatory Visit (INDEPENDENT_AMBULATORY_CARE_PROVIDER_SITE_OTHER): Payer: Medicare Other | Admitting: Pulmonary Disease

## 2013-12-31 ENCOUNTER — Encounter: Payer: Self-pay | Admitting: Pulmonary Disease

## 2013-12-31 VITALS — BP 120/72 | HR 88 | Temp 98.0°F | Ht 66.0 in | Wt 217.2 lb

## 2013-12-31 DIAGNOSIS — I509 Heart failure, unspecified: Secondary | ICD-10-CM

## 2013-12-31 DIAGNOSIS — J438 Other emphysema: Secondary | ICD-10-CM

## 2013-12-31 DIAGNOSIS — D751 Secondary polycythemia: Secondary | ICD-10-CM

## 2013-12-31 DIAGNOSIS — J9610 Chronic respiratory failure, unspecified whether with hypoxia or hypercapnia: Secondary | ICD-10-CM

## 2013-12-31 DIAGNOSIS — I5082 Biventricular heart failure: Secondary | ICD-10-CM

## 2013-12-31 DIAGNOSIS — J439 Emphysema, unspecified: Secondary | ICD-10-CM

## 2013-12-31 DIAGNOSIS — E662 Morbid (severe) obesity with alveolar hypoventilation: Secondary | ICD-10-CM

## 2013-12-31 MED ORDER — MORPHINE SULFATE 15 MG PO TABS: 15.0000 mg | ORAL_TABLET | Freq: Four times a day (QID) | ORAL | Status: DC | PRN

## 2013-12-31 NOTE — Patient Instructions (Signed)
No change in treatment. Stay on oxygen at 6 liters continuous when at home. Try MSIR 15mg  one every 6hrs only if needed for severe shortness of breath.  May try first thing in am since you have so much trouble then.  followup with me again in 34mos, but call if having issues.

## 2013-12-31 NOTE — Progress Notes (Signed)
   Subjective:    Patient ID: Samuel Ball, male    DOB: 1946/02/04, 68 y.o.   MRN: [id number]  HPI Patient comes in today for followup of his chronic respiratory failure, secondary to emphysema and obesity hypoventilation syndrome. The question is also been raised whether he may have some type of shunt, but we have not been able to prove this definitively. He continues to have significant shortness of breath with any activity, and is requiring very high flow oxygen continuous flow to maintain sats with exertion. His saturations at around on 6 L are adequate, but any activity even on 6 L causes his sats to fall. The patient's weight has been fairly stable since the last visit, but is up about 4 pounds. He feels this is more caloric in origin than fluid. He is continuing on his BiPAP religiously, and feels that he sleeps very well with the device.   Review of Systems  Constitutional: Negative for fever and unexpected weight change.  HENT: Negative for congestion, dental problem, ear pain, nosebleeds, postnasal drip, rhinorrhea, sinus pressure, sneezing, sore throat and trouble swallowing.   Eyes: Negative for redness and itching.  Respiratory: Positive for shortness of breath. Negative for cough, chest tightness and wheezing.   Cardiovascular: Negative for palpitations and leg swelling.  Gastrointestinal: Negative for nausea and vomiting.  Genitourinary: Negative for dysuria.  Musculoskeletal: Negative for joint swelling.  Skin: Negative for rash.  Neurological: Negative for headaches.  Hematological: Does not bruise/bleed easily.  Psychiatric/Behavioral: Negative for dysphoric mood. The patient is not nervous/anxious.        Objective:   Physical Exam Obese male in no acute distress Nose without purulence or discharge noted No skin breakdown or pressure necrosis from the CPAP mask Neck without lymphadenopathy or thyromegaly Chest with very diminished breath sounds, no active  wheezing Cardiac exam with regular rate and rhythm Lower extremities with minimal edema, no cyanosis Alert and oriented, moves all 4 extremities.       Assessment & Plan:

## 2013-12-31 NOTE — Assessment & Plan Note (Signed)
The patient continues on his positive pressure device, and feels that he is doing well with this. We have not received her recent download, and we'll get this from his home care company. I have stressed to him the importance of aggressive weight loss.

## 2013-12-31 NOTE — Assessment & Plan Note (Signed)
He has had a recent 2 unit phlebotomy, but did not see any improvement with this in his breathing.

## 2013-12-31 NOTE — Assessment & Plan Note (Signed)
The patient has not had an acute exacerbation or pulmonary infection recently. I've asked him to stay on his bronchodilator regimen as well as oxygen.

## 2013-12-31 NOTE — Assessment & Plan Note (Signed)
The patient continues to struggle with his breathing, and sometimes has panic associated with air hunger.  I have discussed with him the role of narcotics, and he is willing to try immediate release morphine to help with his dyspnea. He does not want heroic measures such as mechanical ventilation or cardiopulmonary resuscitation, but does want good basic medical care and comfort. I also discussed with him again hospice, and he doesn't feel he is ready for this as of yet.

## 2014-01-22 ENCOUNTER — Telehealth: Payer: Self-pay | Admitting: Pulmonary Disease

## 2014-01-22 MED ORDER — PREDNISONE 10 MG PO TABS: ORAL_TABLET | ORAL | Status: DC

## 2014-01-22 NOTE — Telephone Encounter (Signed)
Pt aware of recs below. Pred taper Take 4 tabs po x 2 days, then 3 x 2 days, then 2 x 2 days, then 1 x 2 days then stop., Disp-20 tablet, R-0 sent to pt [facility]- [facility] [address]  Nothing further needed.

## 2014-01-22 NOTE — Telephone Encounter (Signed)
Ok to send in my 8 day prednisone taper.  Try to stay off alleve unless having ongoing pain during the prednisone taper.

## 2014-01-22 NOTE — Telephone Encounter (Signed)
Pt calling requesting Prednisone Rx for gout in his big toe. Pt states he has been taking Aleve or the inflammation and pain.  Pt reports that Dr Gwenette Greet has prescribed this in the past and it helped at lot.  Requesting that this be sent to [place]. Allergies  Allergen Reactions  . Lipitor [Atorvastatin Calcium]     Unknown  . Simvastatin     REACTION: acute myositis   Please advise Dr Gwenette Greet. Thanks.

## 2014-06-03 ENCOUNTER — Other Ambulatory Visit: Payer: Self-pay | Admitting: Pulmonary Disease

## 2014-06-14 ENCOUNTER — Other Ambulatory Visit: Payer: Self-pay

## 2014-07-03 ENCOUNTER — Ambulatory Visit: Payer: Medicare Other | Admitting: Pulmonary Disease

## 2014-07-15 ENCOUNTER — Ambulatory Visit: Payer: Medicare Other | Admitting: Pulmonary Disease

## 2014-07-29 ENCOUNTER — Telehealth: Payer: Self-pay | Admitting: Pulmonary Disease

## 2014-07-29 DIAGNOSIS — J9610 Chronic respiratory failure, unspecified whether with hypoxia or hypercapnia: Secondary | ICD-10-CM

## 2014-07-29 DIAGNOSIS — J439 Emphysema, unspecified: Secondary | ICD-10-CM

## 2014-07-29 NOTE — Telephone Encounter (Signed)
Order placed. Pt aware. Jennifer Castillo, CMA  

## 2014-08-05 ENCOUNTER — Ambulatory Visit: Payer: Medicare Other | Admitting: Pulmonary Disease

## 2014-08-08 ENCOUNTER — Encounter (HOSPITAL_COMMUNITY): Payer: Self-pay | Admitting: Internal Medicine

## 2014-08-26 ENCOUNTER — Ambulatory Visit: Payer: Medicare Other | Admitting: Pulmonary Disease

## 2014-09-04 ENCOUNTER — Ambulatory Visit: Payer: Medicare Other | Admitting: Pulmonary Disease

## 2014-09-26 ENCOUNTER — Ambulatory Visit: Payer: Self-pay | Admitting: Pulmonary Disease

## 2014-10-18 ENCOUNTER — Ambulatory Visit: Payer: Self-pay | Admitting: Pulmonary Disease

## 2014-10-25 ENCOUNTER — Other Ambulatory Visit: Payer: Self-pay | Admitting: Cardiology

## 2014-10-25 ENCOUNTER — Other Ambulatory Visit: Payer: Self-pay | Admitting: Pulmonary Disease

## 2014-11-12 ENCOUNTER — Telehealth: Payer: Self-pay | Admitting: Pulmonary Disease

## 2014-11-12 DIAGNOSIS — E785 Hyperlipidemia, unspecified: Secondary | ICD-10-CM

## 2014-11-12 DIAGNOSIS — R39198 Other difficulties with micturition: Secondary | ICD-10-CM

## 2014-11-12 NOTE — Telephone Encounter (Signed)
Called spoke with pt. Reports [facility] is his PCP. He wants blood work done prior to his appt. Please advise [place] thanks

## 2014-11-13 NOTE — Telephone Encounter (Signed)
Please put in for cmet, cbc with diff, PSA, TSH, fasting lipid profile.

## 2014-11-13 NOTE — Telephone Encounter (Signed)
Pt is aware that his bloodwork has been ordered. Nothing further was needed.

## 2014-11-18 ENCOUNTER — Ambulatory Visit: Payer: Self-pay | Admitting: Pulmonary Disease

## 2014-12-10 ENCOUNTER — Encounter: Payer: Self-pay | Admitting: Pulmonary Disease

## 2014-12-10 ENCOUNTER — Ambulatory Visit (INDEPENDENT_AMBULATORY_CARE_PROVIDER_SITE_OTHER): Payer: Medicare Other | Admitting: Pulmonary Disease

## 2014-12-10 ENCOUNTER — Other Ambulatory Visit (INDEPENDENT_AMBULATORY_CARE_PROVIDER_SITE_OTHER): Payer: Medicare Other

## 2014-12-10 VITALS — BP 120/70 | HR 71 | Temp 97.0°F | Ht 65.0 in | Wt 207.0 lb

## 2014-12-10 DIAGNOSIS — J9611 Chronic respiratory failure with hypoxia: Secondary | ICD-10-CM | POA: Diagnosis not present

## 2014-12-10 DIAGNOSIS — R3919 Other difficulties with micturition: Secondary | ICD-10-CM | POA: Diagnosis not present

## 2014-12-10 DIAGNOSIS — E785 Hyperlipidemia, unspecified: Secondary | ICD-10-CM | POA: Diagnosis not present

## 2014-12-10 DIAGNOSIS — J438 Other emphysema: Secondary | ICD-10-CM

## 2014-12-10 DIAGNOSIS — I509 Heart failure, unspecified: Secondary | ICD-10-CM | POA: Diagnosis not present

## 2014-12-10 DIAGNOSIS — I5082 Biventricular heart failure: Secondary | ICD-10-CM

## 2014-12-10 DIAGNOSIS — R39198 Other difficulties with micturition: Secondary | ICD-10-CM

## 2014-12-10 DIAGNOSIS — E662 Morbid (severe) obesity with alveolar hypoventilation: Secondary | ICD-10-CM

## 2014-12-10 LAB — CBC WITH DIFFERENTIAL/PLATELET
BASOS ABS: 0 10*3/uL (ref 0.0–0.1)
Basophils Relative: 0.3 % (ref 0.0–3.0)
EOS ABS: 0.3 10*3/uL (ref 0.0–0.7)
Eosinophils Relative: 3.4 % (ref 0.0–5.0)
HEMATOCRIT: 53.1 % — AB (ref 39.0–52.0)
Hemoglobin: 17.7 g/dL — ABNORMAL HIGH (ref 13.0–17.0)
LYMPHS ABS: 1.2 10*3/uL (ref 0.7–4.0)
Lymphocytes Relative: 16 % (ref 12.0–46.0)
MCHC: 33.4 g/dL (ref 30.0–36.0)
MCV: 88.3 fl (ref 78.0–100.0)
MONO ABS: 0.5 10*3/uL (ref 0.1–1.0)
Monocytes Relative: 6.2 % (ref 3.0–12.0)
Neutro Abs: 5.5 10*3/uL (ref 1.4–7.7)
Neutrophils Relative %: 74.1 % (ref 43.0–77.0)
PLATELETS: 117 10*3/uL — AB (ref 150.0–400.0)
RBC: 6.01 Mil/uL — ABNORMAL HIGH (ref 4.22–5.81)
RDW: 15 % (ref 11.5–15.5)
WBC: 7.4 10*3/uL (ref 4.0–10.5)

## 2014-12-10 LAB — COMPREHENSIVE METABOLIC PANEL
ALK PHOS: 83 U/L (ref 39–117)
ALT: 23 U/L (ref 0–53)
AST: 25 U/L (ref 0–37)
Albumin: 3.8 g/dL (ref 3.5–5.2)
BUN: 28 mg/dL — ABNORMAL HIGH (ref 6–23)
CO2: 33 mEq/L — ABNORMAL HIGH (ref 19–32)
Calcium: 9.8 mg/dL (ref 8.4–10.5)
Chloride: 98 mEq/L (ref 96–112)
Creatinine, Ser: 1.01 mg/dL (ref 0.40–1.50)
GFR: 77.8 mL/min (ref >60.00)
Glucose, Bld: 107 mg/dL — ABNORMAL HIGH (ref 70–99)
Potassium: 4.2 mEq/L (ref 3.5–5.1)
SODIUM: 139 meq/L (ref 135–145)
Total Bilirubin: 1.2 mg/dL (ref 0.2–1.2)
Total Protein: 7.2 g/dL (ref 6.0–8.3)

## 2014-12-10 LAB — PSA: PSA: 0.54 ng/mL (ref 0.10–4.00)

## 2014-12-10 LAB — LIPID PANEL
CHOL/HDL RATIO: 4
Cholesterol: 180 mg/dL (ref 0–200)
HDL: 45.6 mg/dL (ref >39.00)
LDL CALC: 117 mg/dL — AB (ref 0–99)
NonHDL: 134.4
Triglycerides: 89 mg/dL (ref 0.0–149.0)
VLDL: 17.8 mg/dL (ref 0.0–40.0)

## 2014-12-10 LAB — TSH: TSH: 1.77 u[IU]/mL (ref 0.35–4.50)

## 2014-12-10 MED ORDER — MORPHINE SULFATE 15 MG PO TABS: 15.0000 mg | ORAL_TABLET | ORAL | Status: DC | PRN

## 2014-12-10 MED ORDER — PREDNISONE 10 MG PO TABS: ORAL_TABLET | ORAL | Status: DC

## 2014-12-10 NOTE — Patient Instructions (Signed)
Prednisone taper 40mg  a day for 2 days, then 30mg  a day for 2 days, then 20mg  a day for 2 days, then 10mg  a day indefinitely MSIR 15mg  one up to every 4 hrs if needed for severe shortness of breath.  Remind [place] each month, and we can mail to you.  Increase oxygen to 8 liters when not on your bipap. Bring or mail your bipap card to [place] so we can get a download and look at settings. Let us know if you are getting worse, and we can talk about hospice referral followup with Dr. Lenna Gilford in 21mos.

## 2014-12-10 NOTE — Assessment & Plan Note (Signed)
The pt continues to do well on his IVAPS device, and feels the only time he breathes adequately is when he is on this.

## 2014-12-10 NOTE — Assessment & Plan Note (Signed)
The patient is continuing on his nebulized bronchodilators, and it is unclear how much his COPD is contributing to his overall dyspnea. He does feel that prednisone helps his breathing when he is on a taper, and therefore I will start him on chronic low dose at 5-10 mg a day.

## 2014-12-10 NOTE — Progress Notes (Signed)
   Subjective:    Patient ID: Samuel Ball, male    DOB: 09/08/1945, 69 y.o.   MRN: [id number]  HPI Patient comes in today for follow-up of his multifactorial chronic respiratory failure. He has known severe COPD, biventricular chronic heart failure, obesity hypoventilation syndrome for which he is on an IVAPS device, and finally I have always been suspicious about some type of shunt physiology that we have not been able to prove given his high oxygen demand. He has had a very extensive workup with no reversibility found. At the last visit, we had a discussion about hospice, and he was started on MSIR for comfort. He felt this greatly helped his breathing when he had his bad episodes, but did not get refills on the medication because of transportation issues. He did not want to consider hospice at that time, but he is NCB.  He has had some mild worsening of his shortness of breath since the last visit, and is having significant oxygen desaturations on 6 L. He has concentrators at home that go as high as 10 L.  He has not been having fluid retention, and in fact his weight is down 10 pounds from the last visit. He is wearing his noninvasive positive pressure device compliantly, and denies any chest congestion, cough, or purulence. He has not had any chest pain, or tenderness.   Review of Systems  Constitutional: Negative for fever and unexpected weight change.  HENT: Negative for congestion, dental problem, ear pain, nosebleeds, postnasal drip, rhinorrhea, sinus pressure, sneezing, sore throat and trouble swallowing.   Eyes: Negative for redness and itching.  Respiratory: Positive for cough and shortness of breath. Negative for chest tightness and wheezing.   Cardiovascular: Negative for palpitations and leg swelling.  Gastrointestinal: Negative for nausea and vomiting.  Genitourinary: Negative for dysuria.  Musculoskeletal: Negative for joint swelling.  Skin: Negative for rash.  Neurological:  Negative for headaches.  Hematological: Does not bruise/bleed easily.  Psychiatric/Behavioral: Negative for dysphoric mood. The patient is not nervous/anxious.        Objective:   Physical Exam Overweight male in no acute distress at rest Nose without purulence or discharge noted No skin breakdown or pressure necrosis from the C Pap mask Neck without lymphadenopathy or thyromegaly Chest with decreased breath sounds, but no active wheezes or crackles Cardiac exam with regular rate and rhythm Lower extremities with minimal edema, no cyanosis Alert and oriented, moves all 4 extremities.       Assessment & Plan:

## 2014-12-10 NOTE — Assessment & Plan Note (Signed)
The patient has multifactorial chronic respiratory failure, and is having increasing oxygen needs. I do not see any reversible component to this, and we'll therefore increase his oxygen as needed to maintain adequate saturations. He did see improvement in his quality of life and breathing on MSIR, and we'll therefore keep him on this medication. The patient is NCB, but is not interested in hospice at this time.

## 2014-12-11 ENCOUNTER — Telehealth: Payer: Self-pay | Admitting: Pulmonary Disease

## 2014-12-11 NOTE — Telephone Encounter (Signed)
Patient notified of lab results. Nothing further needed.  

## 2014-12-16 ENCOUNTER — Telehealth: Payer: Self-pay | Admitting: Pulmonary Disease

## 2014-12-16 DIAGNOSIS — J9611 Chronic respiratory failure with hypoxia: Secondary | ICD-10-CM

## 2014-12-16 DIAGNOSIS — E662 Morbid (severe) obesity with alveolar hypoventilation: Secondary | ICD-10-CM

## 2014-12-16 NOTE — Telephone Encounter (Signed)
Let him know that his download shows he is moving good volume of air, but he is having a lot of mask leak.  Does he think it is leaking a lot?? If so, we can either get his home care company to work with him on mask fit, or go to sleep center at [place] and have [place] who is very good at fitting work with him.

## 2014-12-16 NOTE — Telephone Encounter (Signed)
[place] has results in purple folder. Please advise thanks

## 2014-12-17 NOTE — Telephone Encounter (Signed)
Called pt and is aware of results. He wants DME to work with him on mask fit. Order placed. Nothing further needed

## 2014-12-20 ENCOUNTER — Other Ambulatory Visit: Payer: Self-pay | Admitting: Pulmonary Disease

## 2015-01-06 ENCOUNTER — Telehealth: Payer: Self-pay | Admitting: Pulmonary Disease

## 2015-01-06 MED ORDER — MORPHINE SULFATE 15 MG PO TABS: 15.0000 mg | ORAL_TABLET | ORAL | Status: DC | PRN

## 2015-01-06 NOTE — Telephone Encounter (Signed)
RX has been signed and placed in outgoing mail.

## 2015-01-06 NOTE — Telephone Encounter (Signed)
Ok to increase to #80

## 2015-01-06 NOTE — Telephone Encounter (Signed)
Last OV 12/10/14 Last refill 12/10/14 #50  Spoke with pt. He would like to know if [facility] would be willing to increase the quantity he gets per month. Advised him that I would ask [place] if would do this.  [place] - please advise on refill and increase in quantity.

## 2015-01-06 NOTE — Telephone Encounter (Signed)
Spoke with pt and advised that rx was printed and will be mailed to his home.  Address verified with pt.

## 2015-01-29 ENCOUNTER — Telehealth: Payer: Self-pay | Admitting: Pulmonary Disease

## 2015-01-29 NOTE — Telephone Encounter (Signed)
Spoke with pt and he is wanting his morphine RX mailed to him. Last refill 01/06/15 #80 (verfied mailing address). He also reports [place] told him anytime he needed to speak to him, he would give him a call. He is asking to speak with [facility] personally (did not want to inform about what). Please advise [place] thanks

## 2015-01-29 NOTE — Telephone Encounter (Signed)
[place], please mail to pt a prescription for:  Ms contin 30mg , take one each am and can take additional dose in afternoon if needed. #60  I have told pt he can take MSIR in between if needed for shortness of breath.   Please mail my letter with the prescription.

## 2015-01-30 ENCOUNTER — Telehealth: Payer: Self-pay | Admitting: Pulmonary Disease

## 2015-01-30 DIAGNOSIS — J9611 Chronic respiratory failure with hypoxia: Secondary | ICD-10-CM

## 2015-01-30 DIAGNOSIS — E662 Morbid (severe) obesity with alveolar hypoventilation: Secondary | ICD-10-CM

## 2015-01-30 DIAGNOSIS — J438 Other emphysema: Secondary | ICD-10-CM

## 2015-01-30 MED ORDER — MORPHINE SULFATE ER 30 MG PO TBCR
30.0000 mg | EXTENDED_RELEASE_TABLET | Freq: Every morning | ORAL | Status: DC
Start: 2015-01-30 — End: 2015-02-04

## 2015-01-30 MED ORDER — MORPHINE SULFATE 15 MG PO TABS: 15.0000 mg | ORAL_TABLET | ORAL | Status: DC | PRN

## 2015-01-30 NOTE — Telephone Encounter (Signed)
Pt aware referral placed. i have done so. Nothing further needed

## 2015-01-30 NOTE — Telephone Encounter (Signed)
Spoke with pt. He reports he is now ready for hospice. He wants a referral.  Please advise [place] thanks

## 2015-01-30 NOTE — Telephone Encounter (Signed)
Done. rx's and letter sent out.

## 2015-01-30 NOTE — Telephone Encounter (Signed)
Ok to send a hospice referral.  I will be the attending until I leave, then nadel Ok to have hospice doc do symptom management.

## 2015-02-03 ENCOUNTER — Telehealth: Payer: Self-pay | Admitting: Pulmonary Disease

## 2015-02-03 MED ORDER — SENNOSIDES 8.6 MG PO TABS: ORAL_TABLET | ORAL | Status: AC

## 2015-02-03 NOTE — Telephone Encounter (Signed)
Sam is aware of recs. RX sent in. Nothing further needed

## 2015-02-03 NOTE — Telephone Encounter (Signed)
Called Hospice- spoke with Sam, requesting refills of Morphine rx's. Advised that these were printed/signed and mailed to patient on 01/30/15 Pt has not received Rx yet - these were mailed out 01/30/15, pt has already received mail for today and this was not in it. Hospice is hoping to get this tomorrow. Pt has limited amount left that will last him over night. Pt needing something to use PRN for bowels - not sure what was recommended in the letter that was mailed but patient is needing this ASAP Sam is requesting something like Senokot 2-4 tablets 1-2 x qd PRN Constipation. This will have to be written up and either faxed to them. May be able to give verbal to Sam. Please advise Dr Gwenette Greet. Thanks.

## 2015-02-03 NOTE — Telephone Encounter (Signed)
I had recommended Senekot in the letter.  Ok to let hospice know the senekot is ok as they have written .

## 2015-02-04 ENCOUNTER — Telehealth: Payer: Self-pay | Admitting: Pulmonary Disease

## 2015-02-04 ENCOUNTER — Other Ambulatory Visit: Payer: Self-pay | Admitting: Pulmonary Disease

## 2015-02-04 MED ORDER — CLOBETASOL PROPIONATE 0.05 % EX CREA: 1.0000 "application " | TOPICAL_CREAM | Freq: Two times a day (BID) | CUTANEOUS | Status: DC | PRN

## 2015-02-04 MED ORDER — MORPHINE SULFATE ER 30 MG PO TBCR: 30.0000 mg | EXTENDED_RELEASE_TABLET | Freq: Every morning | ORAL | Status: DC

## 2015-02-04 MED ORDER — MORPHINE SULFATE 15 MG PO TABS
15.0000 mg | ORAL_TABLET | ORAL | Status: DC | PRN
Start: 2015-02-04 — End: 2015-03-05

## 2015-02-04 NOTE — Telephone Encounter (Signed)
Per [place] okay to send in RX's for the morphine. I have printed them off for [facility] to sign and will fax once done

## 2015-02-04 NOTE — Telephone Encounter (Signed)
LM x 1 for Samuel Ball to return call.

## 2015-02-04 NOTE — Telephone Encounter (Signed)
Ok with me 

## 2015-02-04 NOTE — Telephone Encounter (Signed)
Helene Kelp returned call. Informed her of the rx of clobetasol. Rx sent to preferred pharmacy. Helene Kelp verbalized understanding and denied any further questions or concerns at this time.

## 2015-02-04 NOTE — Telephone Encounter (Signed)
Spoke with Samuel Ball - Pt still has not received Rx in mail. He is completely out. Hospice is requesting that these be called into Pharmacy to be filled asap.  [facility] [address]  Samuel Ball states that she if they receive the mailed Rx's they will rip them up or can bring them up here to the office as proof that they are not doing anything illegal.  Please advise Dr Gwenette Greet. Thanks.

## 2015-02-04 NOTE — Telephone Encounter (Signed)
Hospice calling to add another medication to list.Samuel Ball

## 2015-02-04 NOTE — Telephone Encounter (Signed)
Rx's faxed to [place] for Morphine.  Helene Kelp aware that Rx's sent Requesting Rx be sent for the refill of his Clobetasol 0.05% cream - uses BID for Eczema. Pt applies to affected areas which have broken out d/t reaction to O2 tubing.  Please advise Dr Gwenette Greet. thanks

## 2015-02-05 ENCOUNTER — Telehealth: Payer: Self-pay | Admitting: Pulmonary Disease

## 2015-02-05 NOTE — Telephone Encounter (Signed)
Called and spoke to pt. [facility]. However, pt also just received the morphine script by mail that was originally the rx the pt needed. Advised pt to hold on to rx until his hospice nurse, Vance Peper, is able to destroy the rx together. I called Clarene Critchley at [phone] and advised her of the issue. Clarene Critchley stated she would call us when she sees the pt on 6/13 to inform us the paper script has been destroyed. Will await for Theresa's call next week.

## 2015-02-11 NOTE — Telephone Encounter (Signed)
error 

## 2015-02-12 ENCOUNTER — Telehealth: Payer: Self-pay | Admitting: Pulmonary Disease

## 2015-02-12 NOTE — Telephone Encounter (Signed)
Noted. Nothing further is needed.

## 2015-02-24 ENCOUNTER — Other Ambulatory Visit: Payer: Self-pay

## 2015-03-05 ENCOUNTER — Telehealth: Payer: Self-pay | Admitting: Pulmonary Disease

## 2015-03-05 MED ORDER — MORPHINE SULFATE 15 MG PO TABS: 15.0000 mg | ORAL_TABLET | ORAL | Status: DC | PRN

## 2015-03-05 MED ORDER — POLYETHYLENE GLYCOL 3350 17 GM/SCOOP PO POWD: 17.0000 g | Freq: Every day | ORAL | Status: AC

## 2015-03-05 MED ORDER — SENNOSIDES-DOCUSATE SODIUM 8.6-50 MG PO TABS: 4.0000 | ORAL_TABLET | Freq: Every day | ORAL | Status: AC

## 2015-03-05 MED ORDER — POLYETHYLENE GLYCOL 3350 17 GM/SCOOP PO POWD: 0.5000 | Freq: Every day | ORAL | Status: DC

## 2015-03-05 MED ORDER — MORPHINE SULFATE ER 30 MG PO TBCR: 30.0000 mg | EXTENDED_RELEASE_TABLET | Freq: Every morning | ORAL | Status: DC

## 2015-03-05 NOTE — Telephone Encounter (Signed)
Rx was clarified and fixed with Alex. Nothing further was needed.

## 2015-03-05 NOTE — Telephone Encounter (Signed)
Spoke with Helene Kelp with Hospice. Pt is needing refills on MS IR 15mg  (1 q4h prn), MS Contin 30mg  (1 QD). Needs Senna changed to Senokot S, pt has not had a bowel movement in several days. The would also like for Miralax to be sent in as well.  Per SN >> okay to refill all medications that are needed.  Rx's will be printed for SN to sign. Once signed I will fax to [place].  Helene Kelp is aware that they will be faxed once they are signed.

## 2015-03-06 ENCOUNTER — Telehealth: Payer: Self-pay | Admitting: Pulmonary Disease

## 2015-03-06 MED ORDER — BUDESONIDE 0.5 MG/2ML IN SUSP: RESPIRATORY_TRACT | Status: AC

## 2015-03-06 MED ORDER — IPRATROPIUM-ALBUTEROL 0.5-2.5 (3) MG/3ML IN SOLN: RESPIRATORY_TRACT | Status: AC

## 2015-03-06 NOTE — Telephone Encounter (Signed)
Spoke with [employer] nurse. Aware RX sent in for budesonide and duoneb. Nothing further needed

## 2015-03-19 ENCOUNTER — Telehealth: Payer: Self-pay | Admitting: Pulmonary Disease

## 2015-03-19 NOTE — Telephone Encounter (Signed)
Spoke with Helene Kelp with Hospice, states that Dr. Francene Finders has ok'ed pt to switch from morphine tabs to  MSIR 20mg /mL concentration 1 ML q4h prn sob.  Pt has been instructed to use 0.82mL first to see if this helps with his sob.    Nothing further needed on our end, just wanting to make Dr. Lenna Gilford aware.

## 2015-04-02 ENCOUNTER — Telehealth: Payer: Self-pay | Admitting: Pulmonary Disease

## 2015-04-02 MED ORDER — MORPHINE SULFATE 15 MG PO TABS: 15.0000 mg | ORAL_TABLET | ORAL | Status: AC | PRN

## 2015-04-02 MED ORDER — MORPHINE SULFATE ER 30 MG PO TBCR: 30.0000 mg | EXTENDED_RELEASE_TABLET | Freq: Every morning | ORAL | Status: DC

## 2015-04-02 MED ORDER — MORPHINE SULFATE ER 30 MG PO TBCR: 30.0000 mg | EXTENDED_RELEASE_TABLET | Freq: Every morning | ORAL | Status: AC

## 2015-04-02 MED ORDER — MORPHINE SULFATE 15 MG PO TABS: 15.0000 mg | ORAL_TABLET | ORAL | Status: DC | PRN

## 2015-04-02 NOTE — Telephone Encounter (Signed)
Hospice calling to get refill on pt's Morphine 15mg  and 30mg . Last refill was 03/05/15.  Please advise if we can refill. Thanks

## 2015-04-02 NOTE — Telephone Encounter (Signed)
Per SN,  Ok to refill both strengths of morphine.

## 2015-04-02 NOTE — Telephone Encounter (Signed)
Spoke with Helene Kelp- aware that [facility]ine Rx are being refilled to [place] with patient - aware also that Rxs refilled. Pt states that he is aware this is an early fill and requests that we date it to refill when needed (04/05/15) so that it wont be denied.  Rxs given to SN to sign - [facility]- noted [facility]PICE PATIENT

## 2015-04-02 NOTE — Telephone Encounter (Signed)
Rxs signed. Faxed to [place] [phone] Nothing further needed.

## 2015-04-08 ENCOUNTER — Encounter: Payer: Self-pay | Admitting: Cardiovascular Disease

## 2015-04-11 ENCOUNTER — Ambulatory Visit: Payer: Medicare Other | Admitting: Pulmonary Disease

## 2015-04-16 ENCOUNTER — Telehealth: Payer: Self-pay | Admitting: Pulmonary Disease

## 2015-04-16 NOTE — Telephone Encounter (Signed)
lmtcb x1 for Teresa. 

## 2015-04-16 NOTE — Telephone Encounter (Signed)
Spoke with Helene Kelp. She will speak to the Hospice docs to make sure they will refill. Will call back if there is a problem with this. Nothing further was needed.

## 2015-04-16 NOTE — Telephone Encounter (Signed)
Helene Kelp states that patient is currently on Morphine Sulfate 20mg /78ml, 0.25-42ml or 5-20mg  every 4 hours, (60ml) MS Contin 30mg  bid.  MSIR stopped. Helene Kelp wants to know if Dr. Lenna Gilford will be willing to refill these medications or if Hospice needs to do it.  SN - please advise.

## 2015-04-16 NOTE — Telephone Encounter (Signed)
Per SN >> I have not seen him in the office. See if Hospice docs will refill, that would save a trip to the office. Let me know if Hospice will not refill.

## 2015-05-01 ENCOUNTER — Ambulatory Visit: Payer: Medicare Other | Admitting: Pulmonary Disease

## 2015-05-06 ENCOUNTER — Ambulatory Visit: Payer: Medicare Other | Admitting: Pulmonary Disease

## 2015-05-21 ENCOUNTER — Ambulatory Visit: Payer: Medicare Other | Admitting: Pulmonary Disease

## 2015-05-27 ENCOUNTER — Encounter: Payer: Self-pay | Admitting: Cardiology

## 2015-06-11 ENCOUNTER — Ambulatory Visit: Payer: Medicare Other | Admitting: Pulmonary Disease

## 2015-07-09 ENCOUNTER — Other Ambulatory Visit: Payer: Self-pay | Admitting: *Deleted

## 2015-07-09 MED ORDER — CLOBETASOL PROPIONATE 0.05 % EX CREA: 1.0000 "application " | TOPICAL_CREAM | Freq: Two times a day (BID) | CUTANEOUS | Status: AC | PRN

## 2015-09-09 ENCOUNTER — Emergency Department (HOSPITAL_COMMUNITY): Payer: PPO

## 2015-09-09 ENCOUNTER — Telehealth: Payer: Self-pay | Admitting: Pulmonary Disease

## 2015-09-09 ENCOUNTER — Encounter (HOSPITAL_COMMUNITY): Payer: Self-pay | Admitting: *Deleted

## 2015-09-09 ENCOUNTER — Emergency Department (HOSPITAL_COMMUNITY)
Admission: EM | Admit: 2015-09-09 | Discharge: 2015-09-09 | Disposition: A | Discharge status: Home / Self Care | Payer: PPO | Attending: Emergency Medicine | Admitting: Emergency Medicine

## 2015-09-09 DIAGNOSIS — E669 Obesity, unspecified: Secondary | ICD-10-CM | POA: Insufficient documentation

## 2015-09-09 DIAGNOSIS — I509 Heart failure, unspecified: Secondary | ICD-10-CM | POA: Insufficient documentation

## 2015-09-09 DIAGNOSIS — J439 Emphysema, unspecified: Secondary | ICD-10-CM | POA: Diagnosis not present

## 2015-09-09 DIAGNOSIS — Z7952 Long term (current) use of systemic steroids: Secondary | ICD-10-CM | POA: Diagnosis not present

## 2015-09-09 DIAGNOSIS — R4189 Other symptoms and signs involving cognitive functions and awareness: Secondary | ICD-10-CM

## 2015-09-09 DIAGNOSIS — G4730 Sleep apnea, unspecified: Secondary | ICD-10-CM | POA: Insufficient documentation

## 2015-09-09 DIAGNOSIS — Z79899 Other long term (current) drug therapy: Secondary | ICD-10-CM | POA: Diagnosis not present

## 2015-09-09 DIAGNOSIS — Z9981 Dependence on supplemental oxygen: Secondary | ICD-10-CM | POA: Insufficient documentation

## 2015-09-09 DIAGNOSIS — Z7982 Long term (current) use of aspirin: Secondary | ICD-10-CM | POA: Insufficient documentation

## 2015-09-09 DIAGNOSIS — Z87891 Personal history of nicotine dependence: Secondary | ICD-10-CM | POA: Insufficient documentation

## 2015-09-09 DIAGNOSIS — I2510 Atherosclerotic heart disease of native coronary artery without angina pectoris: Secondary | ICD-10-CM | POA: Insufficient documentation

## 2015-09-09 DIAGNOSIS — R55 Syncope and collapse: Secondary | ICD-10-CM | POA: Insufficient documentation

## 2015-09-09 DIAGNOSIS — I252 Old myocardial infarction: Secondary | ICD-10-CM | POA: Diagnosis not present

## 2015-09-09 DIAGNOSIS — R0602 Shortness of breath: Secondary | ICD-10-CM | POA: Diagnosis present

## 2015-09-09 LAB — CBC WITH DIFFERENTIAL/PLATELET
BASOS PCT: 0 %
Basophils Absolute: 0 10*3/uL (ref 0.0–0.1)
Eosinophils Absolute: 0 10*3/uL (ref 0.0–0.7)
Eosinophils Relative: 0 %
HCT: 49 % (ref 39.0–52.0)
Hemoglobin: 15.2 g/dL (ref 13.0–17.0)
LYMPHS ABS: 1.1 10*3/uL (ref 0.7–4.0)
Lymphocytes Relative: 8 %
MCH: 31.5 pg (ref 26.0–34.0)
MCHC: 31 g/dL (ref 30.0–36.0)
MCV: 101.7 fL — ABNORMAL HIGH (ref 78.0–100.0)
MONOS PCT: 6 %
Monocytes Absolute: 0.8 10*3/uL (ref 0.1–1.0)
NEUTROS ABS: 11.5 10*3/uL — AB (ref 1.7–7.7)
NEUTROS PCT: 86 %
PLATELETS: 156 10*3/uL (ref 150–400)
RBC: 4.82 MIL/uL (ref 4.22–5.81)
RDW: 14.1 % (ref 11.5–15.5)
WBC: 13.4 10*3/uL — AB (ref 4.0–10.5)

## 2015-09-09 LAB — COMPREHENSIVE METABOLIC PANEL
ALBUMIN: 3.8 g/dL (ref 3.5–5.0)
ALT: 66 U/L — ABNORMAL HIGH (ref 17–63)
ANION GAP: 16 — AB (ref 5–15)
AST: 140 U/L — ABNORMAL HIGH (ref 15–41)
Alkaline Phosphatase: 87 U/L (ref 38–126)
BUN: 38 mg/dL — ABNORMAL HIGH (ref 6–20)
CO2: 37 mmol/L — AB (ref 22–32)
Calcium: 9.3 mg/dL (ref 8.9–10.3)
Chloride: 93 mmol/L — ABNORMAL LOW (ref 101–111)
Creatinine, Ser: 1.76 mg/dL — ABNORMAL HIGH (ref 0.61–1.24)
GFR calc Af Amer: 44 mL/min — ABNORMAL LOW (ref >60)
GFR calc non Af Amer: 38 mL/min — ABNORMAL LOW (ref >60)
GLUCOSE: 154 mg/dL — AB (ref 65–99)
POTASSIUM: 4.6 mmol/L (ref 3.5–5.1)
SODIUM: 146 mmol/L — AB (ref 135–145)
TOTAL PROTEIN: 6.7 g/dL (ref 6.5–8.1)
Total Bilirubin: 1.2 mg/dL (ref 0.3–1.2)

## 2015-09-09 LAB — BRAIN NATRIURETIC PEPTIDE: B NATRIURETIC PEPTIDE 5: 401.4 pg/mL — AB (ref 0.0–100.0)

## 2015-09-09 LAB — I-STAT CG4 LACTIC ACID, ED: Lactic Acid, Venous: 2.41 mmol/L (ref 0.5–2.0)

## 2015-09-09 LAB — ETHANOL: Alcohol, Ethyl (B): <5 mg/dL (ref <5)

## 2015-09-09 MED ORDER — NALOXONE HCL 2 MG/2ML IJ SOSY
1.0000 mg | PREFILLED_SYRINGE | Freq: Once | INTRAMUSCULAR | Status: AC
  Administered 2015-09-09: 1 mg via INTRAVENOUS
  Filled 2015-09-09: qty 2

## 2015-09-09 NOTE — ED Provider Notes (Signed)
CSN: [id number]     Arrival date & time 09/09/15  1103 History   First MD Initiated Contact with Patient 09/09/15 1100     Chief Complaint  Patient presents with  . Shortness of Breath     (Consider location/radiation/quality/duration/timing/severity/associated sxs/prior Treatment) HPI Patient presents via EMS providers after being found unresponsive by family members. Patient was last known normal about 14 hours ago. Family members found him about one hour ago, on the floor, unresponsive. Patient has a known history of CHF, COPD, wears oxygen 24/7. Family members reported to EMS that the patient was found blue, without supplemental oxygen placed this morning. EMS reports to me, that the patient was unresponsive, to painful stimuli, cyanotic on their evaluation. Patient has hospice plans, but family requested transport for emergency department evaluation. The patient himself cannot provide any details of his history of present illness, level V caveat. Patient responds minimally to vigorous sternal rub, otherwise is unresponsive. Past Medical History  Diagnosis Date  . Polycythemia   . Obesity hypoventilation syndrome ([place])   . Cor pulmonale ([place])   . Emphysema of lung ([place])   . Respiratory failure, chronic ([place])   . CHF (congestive heart failure) ([place])   . Myocardial infarction ([place])   . Sleep apnea     uses cpap  . CAD (coronary artery disease)   . H/O myocardial perfusion scan 03/10/2010    a moderate perfusion defect is seen in the basal inferior, mid inferior region. this is consistent with an infarct/scar. there is no scintigraphic evidence of inducible myocardial ischemia. abnormal myocardial perfusion study. abnormal myocardial perfusion imaging. this is a low risk scan. there is a moderate sized scar in the RCA territory unchanged from the prior study.   Past Surgical History  Procedure Laterality Date  . Cardiac stents    . Tonsillectomy    . Vasectomy    . Left and  right heart catheterization with coronary angiogram N/A 06/22/2012    Procedure: LEFT AND RIGHT HEART CATHETERIZATION WITH CORONARY ANGIOGRAM;  Surgeon: Pixie Casino, MD;  Location: [facility] CATH LAB;  Service: Cardiovascular;  Laterality: N/A;   Family History  Problem Relation Age of Onset  . Lung cancer Father   . Heart attack Mother   . Cancer Maternal Grandfather   . Cancer Paternal Grandmother    Social History  Substance Use Topics  . Smoking status: Former Smoker -- 3.00 packs/day for 42 years    Types: Cigarettes    Quit date: 08/31/1999  . Smokeless tobacco: Never Used  . Alcohol Use: No    Review of Systems  Unable to perform ROS: Acuity of condition      Allergies  Lipitor and Simvastatin  Home Medications   Prior to Admission medications   Medication Sig Start Date End Date Taking? Authorizing Provider  albuterol (PROVENTIL) (2.5 MG/3ML) 0.083% nebulizer solution 3 mLs every 4 (four) hours as needed for shortness of breath.  07/08/15  Yes Historical Provider, MD  aspirin 81 MG tablet Take 81 mg by mouth daily.     Yes Historical Provider, MD  clobetasol cream (TEMOVATE) [id number] % Apply 1 application topically 2 (two) times daily as needed. 07/09/15  Yes Noralee Space, MD  enalapril (VASOTEC) 5 MG tablet TAKE 1 BY MOUTH DAILY 10/25/14  Yes Lelon Perla, MD  furosemide (LASIX) 40 MG tablet TAKE 1 BY MOUTH DAILY 10/25/14  Yes Kathee Delton, MD  ipratropium-albuterol (DUONEB) 0.5-2.5 (3) MG/3ML SOLN USE ONE VIAL  VIA NEBULIZER 4 TIMES DAILY. DX j43.8 03/06/15  Yes Noralee Space, MD  LORazepam (ATIVAN) 0.5 MG tablet Take 0.5 mg by mouth every 6 (six) hours as needed for anxiety (or shortness of breath).  07/28/15  Yes Historical Provider, MD  morphine (MS CONTIN) 30 MG 12 hr tablet Take 1 tablet (30 mg total) by mouth every morning. Can take an additional afternoon dose if needed. [facility] PATIENT) 04/02/15  Yes Noralee Space, MD  Morphine Sulfate (MORPHINE CONCENTRATE) 10 mg /  0.5 ml concentrated solution Take 10 mg by mouth every 4 (four) hours as needed for shortness of breath.  08/18/15  Yes Historical Provider, MD  Multiple Vitamin (MULTIVITAMIN) tablet Take 1 tablet by mouth daily.     Yes Historical Provider, MD  OXYGEN Inhale 6-8 L into the lungs continuous.   Yes Historical Provider, MD  predniSONE (DELTASONE) 20 MG tablet Take 20 mg by mouth daily. 09/03/15  Yes Historical Provider, MD  senna-docusate (SENOKOT S) 8.6-50 MG per tablet Take 4 tablets by mouth daily. 03/05/15  Yes Noralee Space, MD  sertraline (ZOLOFT) 50 MG tablet Take 50 mg by mouth daily. 09/02/15  Yes Historical Provider, MD  sodium chloride (OCEAN) 0.65 % SOLN nasal spray Place 1 spray into both nostrils as needed for congestion.   Yes Historical Provider, MD  tamsulosin (FLOMAX) 0.4 MG CAPS capsule Take 0.4 mg by mouth daily.   Yes Historical Provider, MD  vitamin E 400 UNIT capsule Take 400 Units by mouth daily.   Yes Historical Provider, MD  Ascorbic Acid (VITAMIN C) 100 MG tablet Take 100 mg by mouth daily.    Historical Provider, MD  atenolol (TENORMIN) 25 MG tablet TAKE 1 BY MOUTH DAILY 10/25/14   Lelon Perla, MD  budesonide (PULMICORT) 0.5 MG/2ML nebulizer solution USE ONE VIAL VIA NEBULIZER 2 TIMES DAILY. DX J43.8 03/06/15   Noralee Space, MD  morphine (MSIR) 15 MG tablet Take 1 tablet (15 mg total) by mouth every 4 (four) hours as needed (for severe shortness of breath). [facility] PATIENT) Patient not taking: Reported on 09/09/2015 04/02/15   Noralee Space, MD  polyethylene glycol powder (GLYCOLAX/MIRALAX) powder Take 17 g by mouth daily. 03/05/15   Noralee Space, MD  predniSONE (DELTASONE) 10 MG tablet Take 1 tablet (10 mg total) by mouth daily. Patient not taking: Reported on 09/09/2015 02/04/15   Kathee Delton, MD  QUEtiapine (SEROQUEL) 25 MG tablet Take 25 mg by mouth every 6 (six) hours as needed. 07/08/15   Historical Provider, MD  RAPAFLO 8 MG CAPS capsule 8 mg daily with breakfast.   01/19/13   Historical Provider, MD  senna (SENOKOT) 8.6 MG tablet 2-4 tabs 1-2 times a day as needed. HOSPICE PATIENTS 02/03/15   Kathee Delton, MD   BP 88/77 mmHg  Pulse 45  Temp(Src) 90 F (32.2 C) (Rectal)  Resp 30  Ht 5\' 9"  (1.753 m)  Wt 207 lb (93.895 kg)  BMI 30.55 kg/m2  SpO2 94% Physical Exam  Constitutional: He appears distressed.  Obese, obtunded male unresponsive  HENT:  Head: Normocephalic and atraumatic.  Eyes: Conjunctivae and EOM are normal.  Pupils are 2 mm, bilateral, does not track, difficult to appreciate constriction  Cardiovascular: Normal rate and regular rhythm.   Pulmonary/Chest: Effort normal. No stridor. No respiratory distress.  Minimal breath sounds bilaterally,  Abdominal: He exhibits no distension.  Musculoskeletal: He exhibits no edema.  Neurological:  Obtunded, flaccid, responds only to  very vigorous painful stimuli, minimally  Skin: Skin is warm. He is diaphoretic.  Psychiatric: Cognition and memory are impaired.  Nursing note and vitals reviewed.   ED Course  Procedures (including critical care time) Labs Review Labs Reviewed  COMPREHENSIVE METABOLIC PANEL - Abnormal; Notable for the following:    Sodium 146 (*)    Chloride 93 (*)    CO2 37 (*)    Glucose, Bld 154 (*)    BUN 38 (*)    Creatinine, Ser 1.76 (*)    AST 140 (*)    ALT 66 (*)    GFR calc non Af Amer 38 (*)    GFR calc Af Amer 44 (*)    Anion gap 16 (*)    All other components within normal limits  CBC WITH DIFFERENTIAL/PLATELET - Abnormal; Notable for the following:    WBC 13.4 (*)    MCV 101.7 (*)    Neutro Abs 11.5 (*)    All other components within normal limits  BRAIN NATRIURETIC PEPTIDE - Abnormal; Notable for the following:    B Natriuretic Peptide 401.4 (*)    All other components within normal limits  I-STAT CG4 LACTIC ACID, ED - Abnormal; Notable for the following:    Lactic Acid, Venous 2.41 (*)    All other components within normal limits  ETHANOL   I-STAT ARTERIAL BLOOD GAS, ED    Imaging Review Ct Head Wo Contrast  09/09/2015  CLINICAL DATA:  Altered mental status. History of myocardial infarction and congestive heart failure. EXAM: CT HEAD WITHOUT CONTRAST TECHNIQUE: Contiguous axial images were obtained from the base of the skull through the vertex without intravenous contrast. COMPARISON:  None. FINDINGS: There is no evidence of acute intracranial hemorrhage, mass lesion, brain edema or extra-axial fluid collection. The ventricles and subarachnoid spaces are appropriately sized for age. There is no CT evidence of acute cortical infarction. Asymmetric calcification in the left basal ganglia noted. There are intracranial vascular calcifications. Nasogastric tube is in place. There is mild ethmoid sinus mucosal thickening. The visualized paranasal sinuses, mastoid air cells and middle ears are otherwise clear. The calvarium is intact. IMPRESSION: No acute intracranial findings. Mild ethmoid sinus mucosal thickening. Electronically Signed   By: Richardean Sale M.D.   On: 09/09/2015 12:24   Dg Chest Port 1 View  09/09/2015  CLINICAL DATA:  Altered mental status EXAM: PORTABLE CHEST 1 VIEW COMPARISON:  09/27/2011 FINDINGS: 1204 hours. Lungs are hyperexpanded. Interstitial markings are diffusely coarsened with chronic features. Patchy opacity at the right base may be related atelectasis or pneumonia. Cardiopericardial silhouette is at upper limits of normal for size. The visualized bony structures of the thorax are intact. Telemetry leads overlie the chest. IMPRESSION: Borderline cardiomegaly with chronic interstitial coarsening and right base atelectasis or pneumonia. Electronically Signed   By: Misty Stanley M.D.   On: 09/09/2015 12:22   I have personally reviewed and evaluated these images and lab results as part of my medical decision-making.   EKG Interpretation   Date/Time:  Tuesday September 09 2015 11:15:20 EST Ventricular Rate:  107 PR  Interval:    QRS Duration: 111 QT Interval:  401 QTC Calculation: 535 R Axis:   96 Text Interpretation:  Atrial fibrillation Paired ventricular premature  complexes Anterior infarct, old Prolonged QT interval Atrial fibrillation  Premature ventricular complexes Abnormal ekg Confirmed by Carmin Muskrat   MD [id number]) on 09/09/2015 11:25:58 AM     Records notable for hospice enrollment, morphine use, oxygen dependency  1:12 PM Patient in similar condition. I had a lengthy conversation with multiple family members about the patient's goals of care. With acidosis, hypothermia, tachypnea, tachycardia, hypotension, he has poor prognosis.   I discussed patient's case with our hospice team.    On repeat exam patient is in similar condition family concurs for transfer to inpatient hospice care.  MDM   Final diagnoses:  Unresponsiveness   Patient was advanced COPD, home oxygen dependency presents after being found unresponsive at home. Here the patient is hypothermic, tachypneic, hypoxic, unresponsive. Patient's evaluation notable for the aforementioned physical exam findings, as well as acidosis. Patient received oxygen, Narcan, without appreciable change in his condition during the emergency department. After all labs resulted, and the patient had his condition discussed thoroughly times with family members, social worker hospice, he was transferred to inpatient hospice for end-of-life care.  CRITICAL CARE Performed by: Carmin Muskrat Total critical care time: 35 minutes Critical care time was exclusive of separately billable procedures and treating other patients. Critical care was necessary to treat or prevent imminent or life-threatening deterioration. Critical care was time spent personally by me on the following activities: development of treatment plan with patient and/or surrogate as well as nursing, discussions with consultants, evaluation of patient's response to  treatment, examination of patient, obtaining history from patient or surrogate, ordering and performing treatments and interventions, ordering and review of laboratory studies, ordering and review of radiographic studies, pulse oximetry and re-evaluation of patient's condition.    Carmin Muskrat, MD 09/09/15 1314

## 2015-09-09 NOTE — Progress Notes (Signed)
CSW confirmed that pt remains on [facility] list for transport to [facility].  Per [facility], but there have been a significant number of transportation calls today, which have caused delays in pick-up times.  CSW will continue to monitor.

## 2015-09-09 NOTE — Progress Notes (Addendum)
CSW completed medical necessity paperwork and provided it to bedside RN to be placed with DNR paperwork and after visit summary. All of the above mentioned paperwork to be transported with Patient to [facility]. CSW called [facility] for transport. CSW signing off as social work interventions have been met. Please contact if new need(s) arise.   CSW faxed AVS report to [facility] at [phone] per [place], [place] [facility] Liaison   Holly Bodily, [facility] ED Clinical Social Worker [phone]

## 2015-09-09 NOTE — ED Notes (Signed)
Warm blanket applied

## 2015-09-09 NOTE — ED Notes (Signed)
Pt arrives from home via [facility]. Pt is SOB, non-responsive and last seen normal at 9pm last night. Upon EMS arrival pt was non-responsive to pain, blue in color. Pt is a hospice pt with a DNR.

## 2015-09-09 NOTE — Progress Notes (Signed)
ED RN Visit-[facility]  Patient seen in ED with family present at bedside.  Patient only responds to painful stiumuli.  Per ED RN, patient came to ED very cyanotic with core body temperature less than 90 degrees and O2 sat in the low 80's.  Respirations labored with accessory muscle use noted.  [facility] notified after EMS was activated.  Per discussion, plan is to transfer to [facility] from ED for EOL care.  Patient's condition has been discussed with Dr. Tomasa Hosteller and patient has been approved for transfer today to [facility] today.  Offered family emotional support who voiced they are having difficulty processing everything given how quickly his condition has changed.  Hospital social worker to arrange transport.  Family planning on meeting [facility] social worker at [employer] to complete financial paperwork.  RN please call report to [facility] RN at [phone] prior to transport.  Please fax completed discharge summary to [phone].  Please call with any questions.  Thank you, Freddi Starr RN, [facility] Liaison [phone]

## 2015-09-09 NOTE — Telephone Encounter (Signed)
Noted. Will send message to SN to make him aware.

## 2015-10-01 DEATH — deceased
